# Patient Record
Sex: Female | Born: 1955 | Race: White | Hispanic: No | Smoking: Current every day smoker
Health system: Southern US, Community
[De-identification: ages and names within clinical notes are randomized; demographics above are authoritative.]

## PROBLEM LIST (undated history)

## (undated) DIAGNOSIS — F419 Anxiety disorder, unspecified: Secondary | ICD-10-CM

## (undated) DIAGNOSIS — B182 Chronic viral hepatitis C: Secondary | ICD-10-CM

## (undated) DIAGNOSIS — K219 Gastro-esophageal reflux disease without esophagitis: Secondary | ICD-10-CM

## (undated) DIAGNOSIS — I10 Essential (primary) hypertension: Secondary | ICD-10-CM

## (undated) DIAGNOSIS — T7840XA Allergy, unspecified, initial encounter: Secondary | ICD-10-CM

## (undated) DIAGNOSIS — H269 Unspecified cataract: Secondary | ICD-10-CM

## (undated) DIAGNOSIS — M1990 Unspecified osteoarthritis, unspecified site: Secondary | ICD-10-CM

## (undated) DIAGNOSIS — E785 Hyperlipidemia, unspecified: Secondary | ICD-10-CM

## (undated) DIAGNOSIS — F32A Depression, unspecified: Secondary | ICD-10-CM

## (undated) DIAGNOSIS — F329 Major depressive disorder, single episode, unspecified: Secondary | ICD-10-CM

## (undated) DIAGNOSIS — F1910 Other psychoactive substance abuse, uncomplicated: Secondary | ICD-10-CM

## (undated) HISTORY — PX: FRACTURE SURGERY: SHX138

## (undated) HISTORY — DX: Chronic viral hepatitis C: B18.2

## (undated) HISTORY — DX: Allergy, unspecified, initial encounter: T78.40XA

## (undated) HISTORY — DX: Major depressive disorder, single episode, unspecified: F32.9

## (undated) HISTORY — DX: Anxiety disorder, unspecified: F41.9

## (undated) HISTORY — PX: NEPHRECTOMY: SHX65

## (undated) HISTORY — DX: Gastro-esophageal reflux disease without esophagitis: K21.9

## (undated) HISTORY — DX: Depression, unspecified: F32.A

## (undated) HISTORY — DX: Hyperlipidemia, unspecified: E78.5

## (undated) HISTORY — DX: Unspecified cataract: H26.9

## (undated) HISTORY — DX: Other psychoactive substance abuse, uncomplicated: F19.10

## (undated) HISTORY — DX: Unspecified osteoarthritis, unspecified site: M19.90

## (undated) HISTORY — PX: COLONOSCOPY: SHX174

## (undated) HISTORY — DX: Essential (primary) hypertension: I10

---

## 2001-06-30 ENCOUNTER — Other Ambulatory Visit: Admission: RE | Admit: 2001-06-30 | Discharge: 2001-06-30 | Payer: Self-pay | Admitting: Obstetrics & Gynecology

## 2002-09-14 ENCOUNTER — Other Ambulatory Visit: Admission: RE | Admit: 2002-09-14 | Discharge: 2002-09-14 | Payer: Self-pay | Admitting: Obstetrics & Gynecology

## 2003-09-19 ENCOUNTER — Other Ambulatory Visit: Admission: RE | Admit: 2003-09-19 | Discharge: 2003-09-19 | Payer: Self-pay | Admitting: Obstetrics & Gynecology

## 2012-04-08 ENCOUNTER — Encounter: Payer: Self-pay | Admitting: Family Medicine

## 2012-04-08 ENCOUNTER — Ambulatory Visit (INDEPENDENT_AMBULATORY_CARE_PROVIDER_SITE_OTHER): Payer: PRIVATE HEALTH INSURANCE | Admitting: Family Medicine

## 2012-04-08 VITALS — BP 148/80 | HR 82 | Temp 98.4°F | Resp 16 | Ht 63.5 in | Wt 148.2 lb

## 2012-04-08 DIAGNOSIS — L989 Disorder of the skin and subcutaneous tissue, unspecified: Secondary | ICD-10-CM

## 2012-04-08 DIAGNOSIS — Z23 Encounter for immunization: Secondary | ICD-10-CM

## 2012-04-08 DIAGNOSIS — F17200 Nicotine dependence, unspecified, uncomplicated: Secondary | ICD-10-CM

## 2012-04-08 DIAGNOSIS — E559 Vitamin D deficiency, unspecified: Secondary | ICD-10-CM

## 2012-04-08 DIAGNOSIS — Z0000 Encounter for general adult medical examination without abnormal findings: Secondary | ICD-10-CM

## 2012-04-08 MED ORDER — INFLUENZA VIRUS VACC SPLIT PF IM SUSP: 0.5000 mL | INTRAMUSCULAR | Status: DC

## 2012-04-08 NOTE — Progress Notes (Signed)
@[email or web address] @  Patient ID: Jodi Pruitt MRN: [id number], DOB: 04/15/1956, 56 y.o. Date of Encounter: 04/08/2012, 11:34 AM  Primary Physician: No primary provider on file.  Chief Complaint: Physical (CPE)  HPI: 56 y.o. y/o female with history of noted below here for CPE.  Doing well.  Son working at [employer] now and no longer into drugs. Left cheek scaly lesion  Patient interested in hypnosis to quit smoking.  She has tried Chantix (depression), patches, and counseling. Colonoscopy at age 49 Pap:  Sept 2013  Dr. Aldona Bar MMG: Sept 2013 Review of Systems: Consitutional: No fever, chills, fatigue, night sweats, lymphadenopathy, or weight changes. Eyes: No visual changes, eye redness, or discharge. ENT/Mouth: Ears: No otalgia, tinnitus, hearing loss, discharge. Nose: No congestion, rhinorrhea, sinus pain, or epistaxis. Throat: No sore throat, post nasal drip, or teeth pain. Cardiovascular: No CP, palpitations, diaphoresis, DOE, edema, orthopnea, PND. Respiratory: No cough, hemoptysis, SOB, or wheezing. Gastrointestinal: No anorexia, dysphagia, reflux, pain, nausea, vomiting, hematemesis, diarrhea, constipation, BRBPR, or melena. Breast: No discharge, pain, swelling, or mass. Genitourinary: No dysuria, frequency, urgency, hematuria, incontinence, nocturia, amenorrhea, vaginal discharge, pruritis, burning, abnormal bleeding, or pain. Musculoskeletal: No decreased ROM, myalgias, stiffness, joint swelling, or weakness. Skin: No rash, erythema,  pain, warmth, jaundice, or pruritis.  Left cheek pigmented lesion x 1 year Neurological: No headache, dizziness, syncope, seizures, tremors, memory loss, coordination problems, or paresthesias. Psychological: No anxiety, depression, hallucinations, SI/HI. Endocrine: No fatigue, polydipsia, polyphagia, polyuria, or known diabetes. All other systems were reviewed and are otherwise negative.  Past Medical History  Diagnosis Date  . Depression        No past surgical history on file.  Nephrectomy, c/section  Home Meds:  Prior to Admission medications   Medication Sig Start Date End Date Taking? Authorizing Provider  Citalopram Hydrobromide (CELEXA PO) Take 30 mg by mouth daily.   Yes Historical Provider, MD  Multiple Vitamin (MULTIVITAMIN) tablet Take 1 tablet by mouth daily.   Yes Historical Provider, MD  Naproxen Sodium (ALEVE PO) Take by mouth as needed.   Yes Historical Provider, MD    Allergies:  Allergies  Allergen Reactions  . Compazine (Prochlorperazine Edisylate) Other (See Comments)    HALLUCINATION    History   Social History  . Marital Status: Unknown    Spouse Name: N/A    Number of Children: N/A  . Years of Education: N/A   Occupational History  . Not on file.   Social History Main Topics  . Smoking status: Current Every Day Smoker -- 1.5 packs/day for 40 years    Types: Cigarettes  . Smokeless tobacco: Not on file  . Alcohol Use: Yes     DRINK WINE  . Drug Use: Not on file  . Sexually Active: Not on file   Other Topics Concern  . Not on file   Social History Narrative  . No narrative on file    Family History  Problem Relation Age of Onset  . Hypertension Mother   . Parkinson's disease Father   . Heart disease Father   . Cancer Sister   . Cancer Maternal Grandmother     Physical Exam: Blood pressure 148/80, pulse 82, temperature 98.4 F (36.9 C), temperature source Oral, resp. rate 16, height 5' 3.5" (1.613 m), weight 148 lb 3.2 oz (67.223 kg), SpO2 96.00%., Body mass index is 25.84 kg/(m^2). General: Well developed, well nourished, in no acute distress. HEENT: Normocephalic, atraumatic. Conjunctiva pink, sclera non-icteric. Pupils 2 mm constricting to 1  mm, round, regular, and equally reactive to light and accomodation. EOMI. Internal auditory canal clear. TMs with good cone of light and without pathology. Nasal mucosa pink. Nares are without discharge. No sinus tenderness. Oral mucosa  pink. Dentition good. Pharynx without exudate.   Neck: Supple. Trachea midline. No thyromegaly. Full ROM. No lymphadenopathy. Lungs: Clear to auscultation bilaterally without wheezes, rales, or rhonchi. Breathing is of normal effort and unlabored. Cardiovascular: RRR with S1 S2. No murmurs, rubs, or gallops appreciated. Distal pulses 2+ symmetrically. No carotid or abdominal bruits. Abdomen: Soft, non-tender, non-distended with normoactive bowel sounds. No hepatosplenomegaly or masses. No rebound/guarding. No CVA tenderness. Without hernias.  Musculoskeletal: Full range of motion and 5/5 strength throughout. Without swelling, atrophy, tenderness, crepitus, or warmth. Extremities without clubbing, cyanosis, or edema. Calves supple. Skin: Warm and moist without erythema, ecchymosis, wounds, or rash.  Left 2 mm cheek pigmented irreg lesion.  Right varicose veins Neuro: A+Ox3. CN II-XII grossly intact. Moves all extremities spontaneously. Full sensation throughout. Normal gait. DTR 2+ throughout upper and lower extremities. Finger to nose intact. Psych:  Responds to questions appropriately with a normal affect.   Lipids normal from Dr. 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  Interested in quitting smoking. Flu shot given 1. Healthcare maintenance  influenza  inactive virus vaccine (FLUZONE/FLUARIX) injection 0.5 mL  2. Vitamin D deficiency    3. Skin lesion of cheek  Ambulatory referral to Dermatology  4. Smoking addiction  Ambulatory referral to Psychology  5. Need for prophylactic vaccination and inoculation against influenza       Signed, Elvina Sidle, MD 04/08/2012 11:34 AM

## 2012-04-08 NOTE — Patient Instructions (Addendum)

## 2015-09-04 ENCOUNTER — Ambulatory Visit (INDEPENDENT_AMBULATORY_CARE_PROVIDER_SITE_OTHER): Payer: BLUE CROSS/BLUE SHIELD | Admitting: Family Medicine

## 2015-09-04 ENCOUNTER — Ambulatory Visit (INDEPENDENT_AMBULATORY_CARE_PROVIDER_SITE_OTHER): Payer: BLUE CROSS/BLUE SHIELD

## 2015-09-04 VITALS — BP 156/88 | HR 80 | Temp 98.0°F | Resp 16 | Ht 63.5 in | Wt 164.0 lb

## 2015-09-04 DIAGNOSIS — F329 Major depressive disorder, single episode, unspecified: Secondary | ICD-10-CM

## 2015-09-04 DIAGNOSIS — F32A Depression, unspecified: Secondary | ICD-10-CM | POA: Insufficient documentation

## 2015-09-04 DIAGNOSIS — Z0000 Encounter for general adult medical examination without abnormal findings: Secondary | ICD-10-CM

## 2015-09-04 DIAGNOSIS — R1012 Left upper quadrant pain: Secondary | ICD-10-CM

## 2015-09-04 DIAGNOSIS — J410 Simple chronic bronchitis: Secondary | ICD-10-CM | POA: Diagnosis not present

## 2015-09-04 DIAGNOSIS — B182 Chronic viral hepatitis C: Secondary | ICD-10-CM

## 2015-09-04 LAB — POCT URINALYSIS DIP (MANUAL ENTRY)
Bilirubin, UA: NEGATIVE
Blood, UA: NEGATIVE
Glucose, UA: NEGATIVE
Ketones, POC UA: NEGATIVE
Leukocytes, UA: NEGATIVE
Nitrite, UA: NEGATIVE
Spec Grav, UA: 1.015
Urobilinogen, UA: 0.2
pH, UA: 6

## 2015-09-04 LAB — POCT CBC
Granulocyte percent: 54.1 %G (ref 37–80)
HCT, POC: 45 % (ref 37.7–47.9)
Hemoglobin: 15.8 g/dL (ref 12.2–16.2)
Lymph, poc: 3.9 — AB (ref 0.6–3.4)
MCH, POC: 32.5 pg — AB (ref 27–31.2)
MCHC: 35 g/dL (ref 31.8–35.4)
MCV: 93 fL (ref 80–97)
MID (cbc): 0.5 (ref 0–0.9)
MPV: 6.6 fL (ref 0–99.8)
POC Granulocyte: 5.2 (ref 2–6.9)
POC LYMPH PERCENT: 40.2 %L (ref 10–50)
POC MID %: 5.7 %M (ref 0–12)
Platelet Count, POC: 314 10*3/uL (ref 142–424)
RBC: 4.84 M/uL (ref 4.04–5.48)
RDW, POC: 12.9 %
WBC: 9.6 10*3/uL (ref 4.6–10.2)

## 2015-09-04 MED ORDER — CITALOPRAM HYDROBROMIDE 20 MG PO TABS: 30.0000 mg | ORAL_TABLET | Freq: Every day | ORAL | Status: DC

## 2015-09-04 NOTE — Progress Notes (Addendum)
By signing my name below, I, Moises Blood, attest that this documentation has been prepared under the direction and in the presence of Robyn Haber, MD. Electronically Signed: Moises Blood, [place]. 09/04/2015 , 6:55 PM .  Patient was seen in room 11 .   Patient ID: Jodi Pruitt MRN: [id number], DOB: 26-May-1956, 60 y.o. Date of Encounter: 09/04/2015  Primary Physician: No primary care provider on file.  Chief Complaint:  Chief Complaint  Patient presents with  . Annual Exam  . Flank Pain    Left    HPI:  Jodi Pruitt is a 60 y.o. female who presents to [facility] for an annual physical. Her OBGYN (Dr. Stann Mainland) usually does her annual physical in the last few years.   Cancer Screening She notes that colonoscopy is due. Her previous one was done over 10 years ago; reports having polyps, but normal.  She plans to see them for pap smear and mammogram in April.   Left Flank Pain She also notes having left rib pain that was noticed for a few months. When she bends and leans over, she feels like something is being pinched.   Varicose veins She's been on her feet for work over 40 years. She's tried compression socks in the past but without much relief.   Suicidal ideations She's been dealing with a lot of family stress. Her mother had a stroke and for some time, she quit her job to take care of her full time. Her sister also has cancer.   Cough She mentions cough is persistent due to still smoking.   Work She works full time as a Aeronautical engineer at [employer].   Past Medical History  Diagnosis Date  . Depression   . Arthritis      Home Meds: Prior to Admission medications   Medication Sig Start Date End Date Taking? Authorizing Provider  Citalopram Hydrobromide (CELEXA PO) Take 30 mg by mouth daily.    Historical Provider, MD  Multiple Vitamin (MULTIVITAMIN) tablet Take 1 tablet by mouth daily.    Historical Provider, MD    Naproxen Sodium (ALEVE PO) Take by mouth as needed.    Historical Provider, MD    Allergies:  Allergies  Allergen Reactions  . Compazine [Prochlorperazine Edisylate] Other (See Comments)    HALLUCINATION    Social History   Social History  . Marital Status: Unknown    Spouse Name: N/A  . Number of Children: N/A  . Years of Education: N/A   Occupational History  . Not on file.   Social History Main Topics  . Smoking status: Current Every Day Smoker -- 1.50 packs/day for 40 years    Types: Cigarettes  . Smokeless tobacco: Not on file  . Alcohol Use: Yes     Comment: DRINK WINE  . Drug Use: Not on file  . Sexual Activity: Not on file   Other Topics Concern  . Not on file   Social History Narrative     Review of Systems: Constitutional: negative for fever, chills, night sweats, weight changes, or fatigue  HEENT: negative for vision changes, hearing loss, congestion, rhinorrhea, ST, epistaxis, or sinus pressure Cardiovascular: negative for chest pain or palpitations Respiratory: negative for hemoptysis, wheezing, shortness of breath; positive for cough Abdominal: negative for abdominal pain, nausea, vomiting, diarrhea, or constipation Dermatological: negative for rash Musc: positive for left rib pain Neurologic: negative for headache, dizziness, or syncope All other systems reviewed and are otherwise negative  with the exception to those above and in the HPI.  Physical Exam:  Blood pressure 156/88, pulse 80, temperature 98 F (36.7 C), temperature source Oral, resp. rate 16, height 5' 3.5" (1.613 m), weight 164 lb (74.39 kg), SpO2 98 %., Body mass index is 28.59 kg/(m^2). General: Well developed, well nourished, in no acute distress; patient broke down in tears and cried when talking about her mother going to assisted living Head: Normocephalic, atraumatic, eyes without discharge, sclera non-icteric, nares are without discharge. Bilateral auditory canals clear, TM's are  without perforation, pearly grey and translucent with reflective cone of light bilaterally. Oral cavity moist, posterior pharynx without exudate, erythema, peritonsillar abscess, or post nasal drip.  Neck: Supple. No thyromegaly. Full ROM. No lymphadenopathy, no cervical, axillary or inguinal adenopathy Lungs: Clear bilaterally to auscultation without rales, or rhonchi. Breathing is unlabored. Expiratory wheezes bilaterally Heart: RRR with S1 S2. No murmurs, rubs, or gallops appreciated. Abdomen: Soft, non-tender, non-distended with normoactive bowel sounds. No hepatomegaly. No rebound/guarding. No obvious abdominal masses. Msk:  Strength and tone normal for age. Extremities/Skin: Warm and dry. No clubbing or cyanosis. No edema. No rashes or suspicious lesions. Neuro: Alert and oriented X 3. Moves all extremities spontaneously. Gait is normal. CNII-XII grossly in tact. Psych:  Responds to questions appropriately with a normal affect.   Labs: [facility] reading (PRIMARY) by  Dr. Joseph Art: chest xray, prominent left hilum without infiltrates; with gas bubble in LUQ     ASSESSMENT AND PLAN:  60 y.o. year old female with   Will see Dr. Stann Mainland in April. I'm referring her to GI for colonoscopy.  I wrote for a shingles vaccine.   Annual physical exam - Plan: POCT CBC, COMPLETE METABOLIC PANEL WITH GFR, Lipid panel, TSH, Hepatitis panel, acute, DG Abd Acute W/Chest, POCT urinalysis dipstick  LUQ abdominal pain - Plan: Hepatitis panel, acute, DG Abd Acute W/Chest, POCT urinalysis dipstick  Simple chronic bronchitis (HCC) - Plan: DG Abd Acute W/Chest, POCT urinalysis dipstick  Chronic hepatitis C without hepatic coma (HCC) - Plan: Amb Referral to Hepatology  Depression - Plan: citalopram (CELEXA) 20 MG tablet  Although patient has had quite a tough time with her depression, she seems to be doing better now that her mother is in a assisted living facility. It's been a tough call for her because her  sister was not able to help Signed, Robyn Haber, MD 09/04/2015 7:28 PM

## 2015-09-04 NOTE — Patient Instructions (Addendum)
Health Maintenance, Female Adopting a healthy lifestyle and getting preventive care can go a long way to promote health and wellness. Talk with your health care provider about what schedule of regular examinations is right for you. This is a good chance for you to check in with your provider about disease prevention and staying healthy. In between checkups, there are plenty of things you can do on your own. Experts have done a lot of research about which lifestyle changes and preventive measures are most likely to keep you healthy. Ask your health care provider for more information. WEIGHT AND DIET  Eat a healthy diet  Be sure to include plenty of vegetables, fruits, low-fat dairy products, and lean protein.  Do not eat a lot of foods high in solid fats, added sugars, or salt.  Get regular exercise. This is one of the most important things you can do for your health.  Most adults should exercise for at least 150 minutes each week. The exercise should increase your heart rate and make you sweat (moderate-intensity exercise).  Most adults should also do strengthening exercises at least twice a week. This is in addition to the moderate-intensity exercise.  Maintain a healthy weight  Body mass index (BMI) is a measurement that can be used to identify possible weight problems. It estimates body fat based on height and weight. Your health care provider can help determine your BMI and help you achieve or maintain a healthy weight.  For females 20 years of age and older:   A BMI below 18.5 is considered underweight.  A BMI of 18.5 to 24.9 is normal.  A BMI of 25 to 29.9 is considered overweight.  A BMI of 30 and above is considered obese.  Watch levels of cholesterol and blood lipids  You should start having your blood tested for lipids and cholesterol at 62 years of age, then have this test every 5 years.  You may need to have your cholesterol levels checked more often if:  Your lipid  or cholesterol levels are high.  You are older than 62 years of age.  You are at high risk for heart disease.  CANCER SCREENING   Lung Cancer  Lung cancer screening is recommended for adults 55-80 years old who are at high risk for lung cancer because of a history of smoking.  A yearly low-dose CT scan of the lungs is recommended for people who:  Currently smoke.  Have quit within the past 15 years.  Have at least a 30-pack-year history of smoking. A pack year is smoking an average of one pack of cigarettes a day for 1 year.  Yearly screening should continue until it has been 15 years since you quit.  Yearly screening should stop if you develop a health problem that would prevent you from having lung cancer treatment.  Breast Cancer  Practice breast self-awareness. This means understanding how your breasts normally appear and feel.  It also means doing regular breast self-exams. Let your health care provider know about any changes, no matter how small.  If you are in your 20s or 30s, you should have a clinical breast exam (CBE) by a health care provider every 1-3 years as part of a regular health exam.  If you are 40 or older, have a CBE every year. Also consider having a breast X-ray (mammogram) every year.  If you have a family history of breast cancer, talk to your health care provider about genetic screening.  If you   are at high risk for breast cancer, talk to your health care provider about having an MRI and a mammogram every year.  Breast cancer gene (BRCA) assessment is recommended for women who have family members with BRCA-related cancers. BRCA-related cancers include:  Breast.  Ovarian.  Tubal.  Peritoneal cancers.  Results of the assessment will determine the need for genetic counseling and BRCA1 and BRCA2 testing. Cervical Cancer Your health care provider may recommend that you be screened regularly for cancer of the pelvic organs (ovaries, uterus, and  vagina). This screening involves a pelvic examination, including checking for microscopic changes to the surface of your cervix (Pap test). You may be encouraged to have this screening done every 3 years, beginning at age 21.  For women ages 30-65, health care providers may recommend pelvic exams and Pap testing every 3 years, or they may recommend the Pap and pelvic exam, combined with testing for human papilloma virus (HPV), every 5 years. Some types of HPV increase your risk of cervical cancer. Testing for HPV may also be done on women of any age with unclear Pap test results.  Other health care providers may not recommend any screening for nonpregnant women who are considered low risk for pelvic cancer and who do not have symptoms. Ask your health care provider if a screening pelvic exam is right for you.  If you have had past treatment for cervical cancer or a condition that could lead to cancer, you need Pap tests and screening for cancer for at least 20 years after your treatment. If Pap tests have been discontinued, your risk factors (such as having a new sexual partner) need to be reassessed to determine if screening should resume. Some women have medical problems that increase the chance of getting cervical cancer. In these cases, your health care provider may recommend more frequent screening and Pap tests. Colorectal Cancer  This type of cancer can be detected and often prevented.  Routine colorectal cancer screening usually begins at 62 years of age and continues through 62 years of age.  Your health care provider may recommend screening at an earlier age if you have risk factors for colon cancer.  Your health care provider may also recommend using home test kits to check for hidden blood in the stool.  A small camera at the end of a tube can be used to examine your colon directly (sigmoidoscopy or colonoscopy). This is done to check for the earliest forms of colorectal  cancer.  Routine screening usually begins at age 50.  Direct examination of the colon should be repeated every 5-10 years through 62 years of age. However, you may need to be screened more often if early forms of precancerous polyps or small growths are found. Skin Cancer  Check your skin from head to toe regularly.  Tell your health care provider about any new moles or changes in moles, especially if there is a change in a mole's shape or color.  Also tell your health care provider if you have a mole that is larger than the size of a pencil eraser.  Always use sunscreen. Apply sunscreen liberally and repeatedly throughout the day.  Protect yourself by wearing long sleeves, pants, a wide-brimmed hat, and sunglasses whenever you are outside. HEART DISEASE, DIABETES, AND HIGH BLOOD PRESSURE   High blood pressure causes heart disease and increases the risk of stroke. High blood pressure is more likely to develop in:  People who have blood pressure in the high end   of the normal range (130-139/85-89 mm Hg).  People who are overweight or obese.  People who are African American.  If you are 18-39 years of age, have your blood pressure checked every 3-5 years. If you are 40 years of age or older, have your blood pressure checked every year. You should have your blood pressure measured twice--once when you are at a hospital or clinic, and once when you are not at a hospital or clinic. Record the average of the two measurements. To check your blood pressure when you are not at a hospital or clinic, you can use:  An automated blood pressure machine at a pharmacy.  A home blood pressure monitor.  If you are between 55 years and 79 years old, ask your health care provider if you should take aspirin to prevent strokes.  Have regular diabetes screenings. This involves taking a blood sample to check your fasting blood sugar level.  If you are at a normal weight and have a low risk for diabetes,  have this test once every three years after 62 years of age.  If you are overweight and have a high risk for diabetes, consider being tested at a younger age or more often. PREVENTING INFECTION  Hepatitis B  If you have a higher risk for hepatitis B, you should be screened for this virus. You are considered at high risk for hepatitis B if:  You were born in a country where hepatitis B is common. Ask your health care provider which countries are considered high risk.  Your parents were born in a high-risk country, and you have not been immunized against hepatitis B (hepatitis B vaccine).  You have HIV or AIDS.  You use needles to inject street drugs.  You live with someone who has hepatitis B.  You have had sex with someone who has hepatitis B.  You get hemodialysis treatment.  You take certain medicines for conditions, including cancer, organ transplantation, and autoimmune conditions. Hepatitis C  Blood testing is recommended for:  Everyone born from 1945 through 1965.  Anyone with known risk factors for hepatitis C. Sexually transmitted infections (STIs)  You should be screened for sexually transmitted infections (STIs) including gonorrhea and chlamydia if:  You are sexually active and are younger than 62 years of age.  You are older than 62 years of age and your health care provider tells you that you are at risk for this type of infection.  Your sexual activity has changed since you were last screened and you are at an increased risk for chlamydia or gonorrhea. Ask your health care provider if you are at risk.  If you do not have HIV, but are at risk, it may be recommended that you take a prescription medicine daily to prevent HIV infection. This is called pre-exposure prophylaxis (PrEP). You are considered at risk if:  You are sexually active and do not regularly use condoms or know the HIV status of your partner(s).  You take drugs by injection.  You are sexually  active with a partner who has HIV. Talk with your health care provider about whether you are at high risk of being infected with HIV. If you choose to begin PrEP, you should first be tested for HIV. You should then be tested every 3 months for as long as you are taking PrEP.  PREGNANCY   If you are premenopausal and you may become pregnant, ask your health care provider about preconception counseling.  If you may   become pregnant, take 400 to 800 micrograms (mcg) of folic acid every day.  If you want to prevent pregnancy, talk to your health care provider about birth control (contraception). OSTEOPOROSIS AND MENOPAUSE   Osteoporosis is a disease in which the bones lose minerals and strength with aging. This can result in serious bone fractures. Your risk for osteoporosis can be identified using a bone density scan.  If you are 65 years of age or older, or if you are at risk for osteoporosis and fractures, ask your health care provider if you should be screened.  Ask your health care provider whether you should take a calcium or vitamin D supplement to lower your risk for osteoporosis.  Menopause may have certain physical symptoms and risks.  Hormone replacement therapy may reduce some of these symptoms and risks. Talk to your health care provider about whether hormone replacement therapy is right for you.  HOME CARE INSTRUCTIONS   Schedule regular health, dental, and eye exams.  Stay current with your immunizations.   Do not use any tobacco products including cigarettes, chewing tobacco, or electronic cigarettes.  If you are pregnant, do not drink alcohol.  If you are breastfeeding, limit how much and how often you drink alcohol.  Limit alcohol intake to no more than 1 drink per day for nonpregnant women. One drink equals 12 ounces of beer, 5 ounces of wine, or 1 ounces of hard liquor.  Do not use street drugs.  Do not share needles.  Ask your health care provider for help if  you need support or information about quitting drugs.  Tell your health care provider if you often feel depressed.  Tell your health care provider if you have ever been abused or do not feel safe at home.   This information is not intended to replace advice given to you by your health care provider. Make sure you discuss any questions you have with your health care provider.   Document Released: 12/23/2010 Document Revised: 06/30/2014 Document Reviewed: 05/11/2013 Elsevier Interactive Patient Education 2016 [employer]  

## 2015-09-05 LAB — COMPLETE METABOLIC PANEL WITH GFR
ALT: 25 U/L (ref 6–29)
AST: 33 U/L (ref 10–35)
Albumin: 4.2 g/dL (ref 3.6–5.1)
Alkaline Phosphatase: 52 U/L (ref 33–130)
BUN: 19 mg/dL (ref 7–25)
CO2: 24 mmol/L (ref 20–31)
Calcium: 9.3 mg/dL (ref 8.6–10.4)
Chloride: 100 mmol/L (ref 98–110)
Creat: 0.89 mg/dL (ref 0.50–0.99)
GFR, Est African American: 81 mL/min (ref >=60)
GFR, Est Non African American: 70 mL/min (ref >=60)
Glucose, Bld: 84 mg/dL (ref 65–99)
Potassium: 4.4 mmol/L (ref 3.5–5.3)
Sodium: 138 mmol/L (ref 135–146)
Total Bilirubin: 0.7 mg/dL (ref 0.2–1.2)
Total Protein: 8 g/dL (ref 6.1–8.1)

## 2015-09-05 LAB — LIPID PANEL
Cholesterol: 208 mg/dL — ABNORMAL HIGH (ref 125–200)
HDL: 50 mg/dL (ref >=46)
LDL Cholesterol: 139 mg/dL — ABNORMAL HIGH (ref <130)
Total CHOL/HDL Ratio: 4.2 Ratio (ref <=5.0)
Triglycerides: 93 mg/dL (ref <150)
VLDL: 19 mg/dL (ref <30)

## 2015-09-05 LAB — TSH: TSH: 1.62 mIU/L

## 2015-09-06 LAB — HEPATITIS PANEL, ACUTE
HCV Ab: REACTIVE — AB
Hep A IgM: NONREACTIVE
Hep B C IgM: NONREACTIVE
Hepatitis B Surface Ag: NEGATIVE

## 2015-09-07 LAB — HEPATITIS C RNA QUANTITATIVE
HCV Quantitative Log: 6.92 {Log} — ABNORMAL HIGH (ref <1.18)
HCV Quantitative: 8287587 IU/mL — ABNORMAL HIGH (ref <15)

## 2015-10-26 ENCOUNTER — Other Ambulatory Visit: Payer: Self-pay

## 2016-05-29 ENCOUNTER — Other Ambulatory Visit: Payer: Self-pay

## 2016-05-29 MED ORDER — CITALOPRAM HYDROBROMIDE 20 MG PO TABS: 30.0000 mg | ORAL_TABLET | Freq: Every day | ORAL | 0 refills | Status: DC

## 2016-05-29 NOTE — Telephone Encounter (Signed)
New pharm reqd refills of citalopram. Dr L had given pt yrs worth of RFs in March. Sent in the remaining RFs to [place].

## 2016-09-25 ENCOUNTER — Other Ambulatory Visit: Payer: Self-pay | Admitting: Family Medicine

## 2016-09-25 DIAGNOSIS — F329 Major depressive disorder, single episode, unspecified: Secondary | ICD-10-CM

## 2016-09-25 DIAGNOSIS — F32A Depression, unspecified: Secondary | ICD-10-CM

## 2016-10-28 ENCOUNTER — Other Ambulatory Visit: Payer: Self-pay | Admitting: Physician Assistant

## 2016-10-28 DIAGNOSIS — F32A Depression, unspecified: Secondary | ICD-10-CM

## 2016-10-28 DIAGNOSIS — F329 Major depressive disorder, single episode, unspecified: Secondary | ICD-10-CM

## 2016-10-31 NOTE — Telephone Encounter (Signed)
IC pt re: refill on citalopram - pt needs OV. Pt states she is going to another MD because she needs 90 day supply per her insurance carrier.  She stated take her off our list.   I advised her to call her pharmacy and they will stop sending refill requests to [place].

## 2016-12-30 ENCOUNTER — Encounter: Payer: Self-pay | Admitting: Physician Assistant

## 2016-12-30 ENCOUNTER — Ambulatory Visit (INDEPENDENT_AMBULATORY_CARE_PROVIDER_SITE_OTHER): Payer: Commercial Managed Care - PPO | Admitting: Physician Assistant

## 2016-12-30 VITALS — BP 151/81 | HR 81 | Temp 98.9°F | Resp 17 | Ht 64.5 in | Wt 159.0 lb

## 2016-12-30 DIAGNOSIS — F32A Depression, unspecified: Secondary | ICD-10-CM

## 2016-12-30 DIAGNOSIS — F329 Major depressive disorder, single episode, unspecified: Secondary | ICD-10-CM

## 2016-12-30 DIAGNOSIS — I10 Essential (primary) hypertension: Secondary | ICD-10-CM | POA: Diagnosis not present

## 2016-12-30 MED ORDER — CITALOPRAM HYDROBROMIDE 20 MG PO TABS: 30.0000 mg | ORAL_TABLET | Freq: Every day | ORAL | 3 refills | Status: DC

## 2016-12-30 MED ORDER — LISINOPRIL 20 MG PO TABS: 20.0000 mg | ORAL_TABLET | Freq: Every day | ORAL | 3 refills | Status: DC

## 2016-12-30 NOTE — Progress Notes (Signed)
PRIMARY CARE AT [facility] [address] [place] [address] [phone]  Date:  12/30/2016   Name:  Jodi Pruitt   DOB:  June 01, 1956   MRN:  [id number]  PCP:  Joretta Bachelor, PA    History of Present Illness:  Jodi Pruitt is a 61 y.o. female patient who presents to PCP with  Chief Complaint  Patient presents with  . Hypertension     4 days ago [facility] was [facility] at [facility].  ekg performed.  She noticed that the sensation has improved.  Neck pain and hearing a "chirping".  No chest pains. She was encouraged to visit a pcp and to treat her blood pressure.   She hsa been taking celexa for years, she notes that this helps her depression.  She has not had the medication in about 10 days.  She has noted some easy emotion and crying.  She has current stressors with son who lives with her.  He is in his 39s with substance abuse issues.    Patient Active Problem List   Diagnosis Date Noted  . Hepatitis C 09/04/2015  . LUQ abdominal pain 09/04/2015  . Simple chronic bronchitis ([place]) 09/04/2015  . Depression 09/04/2015    Past Medical History:  Diagnosis Date  . Arthritis   . Depression     Past Surgical History:  Procedure Laterality Date  . CESAREAN SECTION    . NEPHRECTOMY      Social History  Substance Use Topics  . Smoking status: Current Every Day Smoker    Packs/day: 1.50    Years: 40.00    Types: Cigarettes  . Smokeless tobacco: Never Used  . Alcohol use Yes     Comment: DRINK WINE    Family History  Problem Relation Age of Onset  . Hypertension Mother   . Heart disease Mother   . Hyperlipidemia Mother   . Stroke Mother   . Parkinson's disease Father   . Heart disease Father   . Cancer Sister   . Cancer Maternal Grandmother     Allergies  Allergen Reactions  . Compazine [Prochlorperazine Edisylate] Other (See Comments)    HALLUCINATION    Medication list has been reviewed and updated.  Current Outpatient  Prescriptions on File Prior to Visit  Medication Sig Dispense Refill  . citalopram (CELEXA) 20 MG tablet Take 1.5 tablets (30 mg total) by mouth daily. (Patient taking differently: Take 40 mg by mouth daily. ) 135 tablet 0  . citalopram (CELEXA) 20 MG tablet TAKE 1 AND 1/2 TABLETS(30 MG) BY MOUTH DAILY 45 tablet 0  . Multiple Vitamin (MULTIVITAMIN) tablet Take 1 tablet by mouth daily. Reported on 09/04/2015    . Naproxen Sodium (ALEVE PO) Take by mouth as needed. Reported on 09/04/2015     No current facility-administered medications on file prior to visit.     ROS ROS otherwise unremarkable unless listed above.  Physical Examination: BP (!) 151/81   Pulse 81   Temp 98.9 F (37.2 C) (Oral)   Resp 17   Ht 5' 4.5" (1.638 m)   Wt 159 lb (72.1 kg)   SpO2 98%   BMI 26.87 kg/m  Ideal Body Weight: Weight in (lb) to have BMI = 25: 147.6  Physical Exam  Constitutional: She is oriented to person, place, and time. She appears well-developed and well-nourished. No distress.  HENT:  Head: Normocephalic and atraumatic.  Right Ear: External ear normal.  Left Ear: External ear normal.  Eyes: Pupils are equal, round, and reactive to light. Conjunctivae and EOM are normal.  Cardiovascular: Normal rate, regular rhythm and normal heart sounds.  Exam reveals no gallop and no friction rub.   No murmur heard. Pulses:      Radial pulses are 2+ on the right side, and 2+ on the left side.  Pulmonary/Chest: Effort normal. No respiratory distress.  Neurological: She is alert and oriented to person, place, and time.  Skin: She is not diaphoretic.  Psychiatric: She has a normal mood and affect. Her behavior is normal.     Assessment and Plan: [name] is a 61 y.o. female who is here today for blood pressure. Advised to return in 1 month.   Will fill celexa for 1 year.   Printed labs to obtain at her workplace.    Essential hypertension - Plan: lisinopril (PRINIVIL,ZESTRIL) 20 MG  tablet, CMP14+EGFR, Lipid panel, TSH  Depression, unspecified depression type - Plan: citalopram (CELEXA) 20 MG tablet  Ivar Drape, PA-C [facility] 7/13/20189:22 AM  There are no diagnoses linked to this encounter.  Ivar Drape, PA-C [facility] 12/30/2016 9:42 AM

## 2016-12-30 NOTE — Patient Instructions (Addendum)
DASH Eating Plan DASH stands for "Dietary Approaches to Stop Hypertension." The DASH eating plan is a healthy eating plan that has been shown to reduce high blood pressure (hypertension). It may also reduce your risk for type 2 diabetes, heart disease, and stroke. The DASH eating plan may also help with weight loss. What are tips for following this plan? General guidelines  Avoid eating more than 2,300 mg (milligrams) of salt (sodium) a day. If you have hypertension, you may need to reduce your sodium intake to 1,500 mg a day.  Limit alcohol intake to no more than 1 drink a day for nonpregnant women and 2 drinks a day for men. One drink equals 12 oz of beer, 5 oz of wine, or 1 oz of hard liquor.  Work with your health care provider to maintain a healthy body weight or to lose weight. Ask what an ideal weight is for you.  Get at least 30 minutes of exercise that causes your heart to beat faster (aerobic exercise) most days of the week. Activities may include walking, swimming, or biking.  Work with your health care provider or diet and nutrition specialist (dietitian) to adjust your eating plan to your individual calorie needs. Reading food labels  Check food labels for the amount of sodium per serving. Choose foods with less than 5 percent of the Daily Value of sodium. Generally, foods with less than 300 mg of sodium per serving fit into this eating plan.  To find whole grains, look for the word "whole" as the first word in the ingredient list. Shopping  Buy products labeled as "low-sodium" or "no salt added."  Buy fresh foods. Avoid canned foods and premade or frozen meals. Cooking  Avoid adding salt when cooking. Use salt-free seasonings or herbs instead of table salt or sea salt. Check with your health care provider or pharmacist before using salt substitutes.  Do not fry foods. Cook foods using healthy methods such as baking, boiling, grilling, and broiling instead.  Cook with  heart-healthy oils, such as olive, canola, soybean, or sunflower oil. Meal planning   Eat a balanced diet that includes: ? 5 or more servings of fruits and vegetables each day. At each meal, try to fill half of your plate with fruits and vegetables. ? Up to 6-8 servings of whole grains each day. ? Less than 6 oz of lean meat, poultry, or fish each day. A 3-oz serving of meat is about the same size as a deck of cards. One egg equals 1 oz. ? 2 servings of low-fat dairy each day. ? A serving of nuts, seeds, or beans 5 times each week. ? Heart-healthy fats. Healthy fats called Omega-3 fatty acids are found in foods such as flaxseeds and coldwater fish, like sardines, salmon, and mackerel.  Limit how much you eat of the following: ? Canned or prepackaged foods. ? Food that is high in trans fat, such as fried foods. ? Food that is high in saturated fat, such as fatty meat. ? Sweets, desserts, sugary drinks, and other foods with added sugar. ? Full-fat dairy products.  Do not salt foods before eating.  Try to eat at least 2 vegetarian meals each week.  Eat more home-cooked food and less restaurant, buffet, and fast food.  When eating at a restaurant, ask that your food be prepared with less salt or no salt, if possible. What foods are recommended? The items listed may not be a complete list. Talk with your dietitian about what   dietary choices are best for you. Grains Whole-grain or whole-wheat bread. Whole-grain or whole-wheat pasta. Brown rice. Oatmeal. Quinoa. Bulgur. Whole-grain and low-sodium cereals. Pita bread. Low-fat, low-sodium crackers. Whole-wheat flour tortillas. Vegetables Fresh or frozen vegetables (raw, steamed, roasted, or grilled). Low-sodium or reduced-sodium tomato and vegetable juice. Low-sodium or reduced-sodium tomato sauce and tomato paste. Low-sodium or reduced-sodium canned vegetables. Fruits All fresh, dried, or frozen fruit. Canned fruit in natural juice (without  added sugar). Meat and other protein foods Skinless chicken or turkey. Ground chicken or turkey. Pork with fat trimmed off. Fish and seafood. Egg whites. Dried beans, peas, or lentils. Unsalted nuts, nut butters, and seeds. Unsalted canned beans. Lean cuts of beef with fat trimmed off. Low-sodium, lean deli meat. Dairy Low-fat (1%) or fat-free (skim) milk. Fat-free, low-fat, or reduced-fat cheeses. Nonfat, low-sodium ricotta or cottage cheese. Low-fat or nonfat yogurt. Low-fat, low-sodium cheese. Fats and oils Soft margarine without trans fats. Vegetable oil. Low-fat, reduced-fat, or light mayonnaise and salad dressings (reduced-sodium). Canola, safflower, olive, soybean, and sunflower oils. Avocado. Seasoning and other foods Herbs. Spices. Seasoning mixes without salt. Unsalted popcorn and pretzels. Fat-free sweets. What foods are not recommended? The items listed may not be a complete list. Talk with your dietitian about what dietary choices are best for you. Grains Baked goods made with fat, such as croissants, muffins, or some breads. Dry pasta or rice meal packs. Vegetables Creamed or fried vegetables. Vegetables in a cheese sauce. Regular canned vegetables (not low-sodium or reduced-sodium). Regular canned tomato sauce and paste (not low-sodium or reduced-sodium). Regular tomato and vegetable juice (not low-sodium or reduced-sodium). Pickles. Olives. Fruits Canned fruit in a light or heavy syrup. Fried fruit. Fruit in cream or butter sauce. Meat and other protein foods Fatty cuts of meat. Ribs. Fried meat. Bacon. Sausage. Bologna and other processed lunch meats. Salami. Fatback. Hotdogs. Bratwurst. Salted nuts and seeds. Canned beans with added salt. Canned or smoked fish. Whole eggs or egg yolks. Chicken or turkey with skin. Dairy Whole or 2% milk, cream, and half-and-half. Whole or full-fat cream cheese. Whole-fat or sweetened yogurt. Full-fat cheese. Nondairy creamers. Whipped toppings.  Processed cheese and cheese spreads. Fats and oils Butter. Stick margarine. Lard. Shortening. Ghee. Bacon fat. Tropical oils, such as coconut, palm kernel, or palm oil. Seasoning and other foods Salted popcorn and pretzels. Onion salt, garlic salt, seasoned salt, table salt, and sea salt. Worcestershire sauce. Tartar sauce. Barbecue sauce. Teriyaki sauce. Soy sauce, including reduced-sodium. Steak sauce. Canned and packaged gravies. Fish sauce. Oyster sauce. Cocktail sauce. Horseradish that you find on the shelf. Ketchup. Mustard. Meat flavorings and tenderizers. Bouillon cubes. Hot sauce and Tabasco sauce. Premade or packaged marinades. Premade or packaged taco seasonings. Relishes. Regular salad dressings. Where to find more information:  National Heart, Lung, and Blood Institute: www.nhlbi.nih.gov  American Heart Association: www.heart.org Summary  The DASH eating plan is a healthy eating plan that has been shown to reduce high blood pressure (hypertension). It may also reduce your risk for type 2 diabetes, heart disease, and stroke.  With the DASH eating plan, you should limit salt (sodium) intake to 2,300 mg a day. If you have hypertension, you may need to reduce your sodium intake to 1,500 mg a day.  When on the DASH eating plan, aim to eat more fresh fruits and vegetables, whole grains, lean proteins, low-fat dairy, and heart-healthy fats.  Work with your health care provider or diet and nutrition specialist (dietitian) to adjust your eating plan to your individual   individual calorie needs. This information is not intended to replace advice given to you by your health care provider. Make sure you discuss any questions you have with your health care provider. Document Released: 05/29/2011 Document Revised: 06/02/2016 Document Reviewed: 06/02/2016 Elsevier Interactive Patient Education  2017 Elsevier Inc.    IF you received an x-ray today, you will receive an invoice from [facility].  Please contact [facility] at [phone] with questions or concerns regarding your invoice.   IF you received labwork today, you will receive an invoice from [facility]. Please contact [facility] at [phone] with questions or concerns regarding your invoice.   Our billing staff will not be able to assist you with questions regarding bills from these companies.  You will be contacted with the lab results as soon as they are available. The fastest way to get your results is to activate your My Chart account. Instructions are located on the last page of this paperwork. If you have not heard from us regarding the results in 2 weeks, please contact this office.     

## 2017-01-26 ENCOUNTER — Encounter: Payer: Self-pay | Admitting: Physician Assistant

## 2017-01-26 ENCOUNTER — Ambulatory Visit (INDEPENDENT_AMBULATORY_CARE_PROVIDER_SITE_OTHER): Payer: Commercial Managed Care - PPO | Admitting: Physician Assistant

## 2017-01-26 VITALS — BP 122/73 | HR 75 | Temp 97.1°F | Resp 16 | Ht 64.0 in | Wt 158.0 lb

## 2017-01-26 DIAGNOSIS — I10 Essential (primary) hypertension: Secondary | ICD-10-CM | POA: Diagnosis not present

## 2017-01-26 DIAGNOSIS — Z0000 Encounter for general adult medical examination without abnormal findings: Secondary | ICD-10-CM

## 2017-01-26 DIAGNOSIS — Z130 Encounter for screening for diseases of the blood and blood-forming organs and certain disorders involving the immune mechanism: Secondary | ICD-10-CM | POA: Diagnosis not present

## 2017-01-26 DIAGNOSIS — F32A Depression, unspecified: Secondary | ICD-10-CM

## 2017-01-26 DIAGNOSIS — F329 Major depressive disorder, single episode, unspecified: Secondary | ICD-10-CM | POA: Diagnosis not present

## 2017-01-26 DIAGNOSIS — Z1211 Encounter for screening for malignant neoplasm of colon: Secondary | ICD-10-CM | POA: Diagnosis not present

## 2017-01-26 MED ORDER — CITALOPRAM HYDROBROMIDE 40 MG PO TABS: 40.0000 mg | ORAL_TABLET | Freq: Every day | ORAL | 3 refills | Status: DC

## 2017-01-26 MED ORDER — LOSARTAN POTASSIUM 50 MG PO TABS: 50.0000 mg | ORAL_TABLET | Freq: Every day | ORAL | 1 refills | Status: DC

## 2017-01-26 NOTE — Progress Notes (Signed)
PRIMARY CARE AT [facility] [address], [place] [address] [phone]  Date:  01/26/2017   Name:  Jodi Pruitt   DOB:  1956-05-23   MRN:  [id number]  PCP:  Joretta Bachelor, PA    History of Present Illness:  Jodi Pruitt is a 61 y.o. female patient who presents to PCP with  Chief Complaint  Patient presents with  . Annual Exam    med refill, pt would like to change BP meds b/c its making her cough. no PAP     DIET: She is eating frozen entrees which are [employer] which are more organic. She may have a biscuit.  Coffee--3 cups. 1 soda per day  Water intake: 2-3 bottles.    BM: normal.  No blood or black stool.  No constipation or diarrhea.   URINATION: normal.  Dysuria, hematuria, frequency  SLEEP: sleep is fine until cough (see below)  EtOH: 2 drinks per day, cut down since bp issue. Tobacco or vaping: 1ppd since 61 years old   Illicit drug use: none at this time 26s  She is less labile, now that she was off of the celexa.   She feels clammy at work, which is new.  Feels like a hot flash.  No chest pains or palpitaitons.  No sob.  She is coughing with the start of the lisinopril.  She is checking bp at home.   Followed by Dr. Stann Mainland gynecologist--followed.  Mammogram performed by gynecology as well Sister with uterine cancer.    Patient Active Problem List   Diagnosis Date Noted  . Hepatitis C 09/04/2015  . LUQ abdominal pain 09/04/2015  . Simple chronic bronchitis ([place]) 09/04/2015  . Depression 09/04/2015    Past Medical History:  Diagnosis Date  . Anxiety   . Arthritis   . Depression     Past Surgical History:  Procedure Laterality Date  . CESAREAN SECTION    . FRACTURE SURGERY    . NEPHRECTOMY      Social History  Substance Use Topics  . Smoking status: Current Every Day Smoker    Packs/day: 1.50    Years: 40.00    Types: Cigarettes  . Smokeless tobacco: Never Used  . Alcohol use Yes     Comment: DRINK WINE    Family  History  Problem Relation Age of Onset  . Hypertension Mother   . Heart disease Mother   . Hyperlipidemia Mother   . Stroke Mother   . Parkinson's disease Father   . Heart disease Father   . Cancer Sister   . Cancer Maternal Grandmother     Allergies  Allergen Reactions  . Compazine [Prochlorperazine Edisylate] Other (See Comments)    HALLUCINATION    Medication list has been reviewed and updated.  Current Outpatient Prescriptions on File Prior to Visit  Medication Sig Dispense Refill  . citalopram (CELEXA) 20 MG tablet Take 1.5 tablets (30 mg total) by mouth daily. 135 tablet 3  . lisinopril (PRINIVIL,ZESTRIL) 20 MG tablet Take 1 tablet (20 mg total) by mouth daily. 90 tablet 3  . Multiple Vitamin (MULTIVITAMIN) tablet Take 1 tablet by mouth daily. Reported on 09/04/2015    . Naproxen Sodium (ALEVE PO) Take by mouth as needed. Reported on 09/04/2015     No current facility-administered medications on file prior to visit.     ROS ROS otherwise unremarkable unless listed above.  Physical Examination: BP 122/73   Pulse 75   Temp (!) 97.1 F (  36.2 C) (Oral)   Resp 16   Ht 5\' 4"  (1.626 m)   Wt 158 lb (71.7 kg)   SpO2 96%   BMI 27.12 kg/m  Ideal Body Weight: Weight in (lb) to have BMI = 25: 145.3  Physical Exam  Constitutional: She is oriented to person, place, and time. She appears well-developed and well-nourished. No distress.  HENT:  Head: Normocephalic and atraumatic.  Right Ear: External ear normal.  Left Ear: External ear normal.  Eyes: Pupils are equal, round, and reactive to light. Conjunctivae and EOM are normal.  Cardiovascular: Normal rate and regular rhythm.   Pulses:      Radial pulses are 2+ on the right side, and 2+ on the left side.       Dorsalis pedis pulses are 2+ on the right side, and 2+ on the left side.  Varicosities and spider veins of right leg  Pulmonary/Chest: Effort normal. No respiratory distress. She has no decreased breath sounds. She  has no wheezes. She has no rhonchi.  Neurological: She is alert and oriented to person, place, and time.  Skin: She is not diaphoretic.  Psychiatric: She has a normal mood and affect. Her behavior is normal.     Assessment and Plan: Jodi Pruitt is a 61 y.o. female who is here today for physical exam and bp recheck. Stop lisinopril and start losartan.  If there are no side effects, will fill for 6 months (additional 4 months).   She does not want the hep C recheck and possible treatment.  Follow up at next visit.  Liver enzymes obtained today. Refilled celexa at 40mg  daily. Coloscopy ordered today. Annual physical exam - Plan: CBC  Essential hypertension - Plan: losartan (COZAAR) 50 MG tablet  Depression, unspecified depression type  Special screening for malignant neoplasms, colon - Plan: Ambulatory referral to Gastroenterology  Screening for deficiency anemia - Plan: CBC  Ivar Drape, PA-C [facility] 8/6/20188:28 PM

## 2017-01-26 NOTE — Patient Instructions (Addendum)
[facility] [address] [place], [place] [address]   Burnard Leigh [phone]  Horton Finer [phone]  Everardo Beals [phone]   [facility] ([place], [place], [place], [place]) - [phone]  [facility]) - [facility] - [phone]  [facility] [place] - [phone]  [facility]  - [phone] (do not file insurance)   You can also check out psychologytoday.[email or web address] for other therapist.  If you need me to send a referral, just contact me.   Keeping You Healthy  Get These Tests  Blood Pressure- Have your blood pressure checked by your healthcare provider at least once a year.  Normal blood pressure is 120/80.  Weight- Have your body mass index (BMI) calculated to screen for obesity.  BMI is a measure of body fat based on height and weight.  You can calculate your own BMI at GravelBags.it  Cholesterol- Have your cholesterol checked every year.  Diabetes- Have your blood sugar checked every year if you have high blood pressure, high cholesterol, a family history of diabetes or if you are overweight.  Pap Test - Have a pap test every 1 to 5 years if you have been sexually active.  If you are older than 65 and recent pap tests have been normal you may not need additional pap tests.  In addition, if you have had a hysterectomy  for benign disease additional pap tests are not necessary.  Mammogram-Yearly mammograms are essential for early detection of breast cancer  Screening for Colon Cancer- Colonoscopy starting at age 39. Screening may begin sooner depending on your family history and other health conditions.  Follow up colonoscopy as directed by your Gastroenterologist.  Screening for Osteoporosis- Screening begins at age 37 with bone density scanning, sooner  if you are at higher risk for developing Osteoporosis.  Get these medicines  Calcium with Vitamin D- Your body requires 1200-1500 mg of Calcium a day and [phone] IU of Vitamin D a day.  You can only absorb 500 mg of Calcium at a time therefore Calcium must be taken in 2 or 3 separate doses throughout the day.  Hormones- Hormone therapy has been associated with increased risk for certain cancers and heart disease.  Talk to your healthcare provider about if you need relief from menopausal symptoms.  Aspirin- Ask your healthcare provider about taking Aspirin to prevent Heart Disease and Stroke.  Get these Immuniztions  Flu shot- Every fall  Pneumonia shot- Once after the age of 66; if you are younger ask your healthcare provider if you need a pneumonia shot.  Tetanus- Every ten years.  Zostavax- Once after the age of 51 to prevent shingles.  Take these steps  Don't smoke- Your healthcare provider can help you quit. For tips on how to quit, ask your healthcare provider or go to www.smokefree.gov or call 1-800 QUIT-NOW.  Be physically active- Exercise 5 days a week for a minimum of 30 minutes.  If you are not already physically active, start slow and gradually work up to 30 minutes of moderate physical activity.  Try walking, dancing, bike riding, swimming, etc.  Eat a healthy diet- Eat a variety of healthy foods such as fruits, vegetables, whole grains, low fat milk, low fat cheeses, yogurt, lean meats, chicken, fish, eggs, dried beans, tofu, etc.  For more information go to www.thenutritionsource.org  Dental visit- Brush and floss teeth twice daily; visit your  dentist twice a year.  Eye exam- Visit your Optometrist or Ophthalmologist yearly.  Drink alcohol in moderation- Limit alcohol intake to one drink or less a day.  Never drink and drive.  Depression- Your emotional health is as important as your physical health.  If you're feeling down or losing interest in things you normally  enjoy, please talk to your healthcare provider.  Seat Belts- can save your life; always wear one  Smoke/Carbon Monoxide detectors- These detectors need to be installed on the appropriate level of your home.  Replace batteries at least once a year.  Violence- If anyone is threatening or hurting you, please tell your healthcare provider.  Living Will/ Health care power of attorney- Discuss with your healthcare provider and family.    IF you received an x-ray today, you will receive an invoice from [facility]. Please contact [facility] at [phone] with questions or concerns regarding your invoice.   IF you received labwork today, you will receive an invoice from [facility]. Please contact [facility] at [phone] with questions or concerns regarding your invoice.   Our billing staff will not be able to assist you with questions regarding bills from these companies.  You will be contacted with the lab results as soon as they are available. The fastest way to get your results is to activate your My Chart account. Instructions are located on the last page of this paperwork. If you have not heard from [place] regarding the results in 2 weeks, please contact this office.

## 2017-02-05 ENCOUNTER — Telehealth: Payer: Self-pay

## 2017-02-26 ENCOUNTER — Other Ambulatory Visit: Payer: Self-pay | Admitting: Physician Assistant

## 2017-03-02 ENCOUNTER — Encounter: Payer: Self-pay | Admitting: Physician Assistant

## 2017-03-24 ENCOUNTER — Other Ambulatory Visit: Payer: Self-pay | Admitting: Physician Assistant

## 2017-03-24 DIAGNOSIS — I10 Essential (primary) hypertension: Secondary | ICD-10-CM

## 2017-03-24 NOTE — Telephone Encounter (Signed)
Refill request for losartan 50 mg #30 with 3 refill approved. Dgaddy, CMA

## 2017-09-23 ENCOUNTER — Encounter: Payer: Self-pay | Admitting: Physician Assistant

## 2017-12-14 ENCOUNTER — Other Ambulatory Visit: Payer: Self-pay | Admitting: Family Medicine

## 2017-12-14 DIAGNOSIS — I10 Essential (primary) hypertension: Secondary | ICD-10-CM

## 2017-12-15 ENCOUNTER — Ambulatory Visit: Payer: Self-pay | Admitting: Physician Assistant

## 2017-12-15 NOTE — Telephone Encounter (Signed)
Feels more foggy headed than woozey . Pt is awake and alert  oriented . Missed a dose of her bp med  several days ago. Appt made with Dr Pamella Pert for tomorrow. Pt advised precautions.  Reason for Disposition . Systolic BP  >= 959 OR Diastolic >= 747  Answer Assessment - Initial Assessment Questions 1. BLOOD PRESSURE: "What is the blood pressure?" "Did you take at least two measurements 5 minutes apart?"     168/106  172/105   Pulse 75   2. ONSET: "When did you take your blood pressure?"    1130   3. HOW: "How did you obtain the blood pressure?" (e.g., visiting nurse, automatic home BP monitor)     Automatic machine  4. HISTORY: "Do you have a history of high blood pressure?"     yes 5. MEDICATIONS: "Are you taking any medications for blood pressure?" "Have you missed any doses recently?"      Missed dose 2 days ago   6. OTHER SYMPTOMS: "Do you have any symptoms?" (e.g., headache, chest pain, blurred vision, difficulty breathing, weakness)        Headache this am  Went away  Feels slightly woozy    7. PREGNANCY: "Is there any chance you are pregnant?" "When was your last menstrual period?"     n/a  Protocols used: HIGH BLOOD PRESSURE-A-AH

## 2017-12-16 ENCOUNTER — Encounter: Payer: Self-pay | Admitting: Family Medicine

## 2017-12-16 ENCOUNTER — Other Ambulatory Visit: Payer: Self-pay

## 2017-12-16 ENCOUNTER — Ambulatory Visit: Payer: Commercial Managed Care - PPO | Admitting: Family Medicine

## 2017-12-16 VITALS — BP 124/82 | HR 90 | Temp 98.7°F | Ht 63.0 in | Wt 149.4 lb

## 2017-12-16 DIAGNOSIS — Z23 Encounter for immunization: Secondary | ICD-10-CM

## 2017-12-16 DIAGNOSIS — I10 Essential (primary) hypertension: Secondary | ICD-10-CM | POA: Diagnosis not present

## 2017-12-16 DIAGNOSIS — Z1231 Encounter for screening mammogram for malignant neoplasm of breast: Secondary | ICD-10-CM | POA: Diagnosis not present

## 2017-12-16 DIAGNOSIS — B182 Chronic viral hepatitis C: Secondary | ICD-10-CM

## 2017-12-16 NOTE — Patient Instructions (Signed)
     IF you received an x-ray today, you will receive an invoice from [facility]. Please contact [facility] at [phone] with questions or concerns regarding your invoice.   IF you received labwork today, you will receive an invoice from [facility]. Please contact [facility] at [phone] with questions or concerns regarding your invoice.   Our billing staff will not be able to assist you with questions regarding bills from these companies.  You will be contacted with the lab results as soon as they are available. The fastest way to get your results is to activate your My Chart account. Instructions are located on the last page of this paperwork. If you have not heard from us regarding the results in 2 weeks, please contact this office.     

## 2017-12-16 NOTE — Progress Notes (Signed)
6/26/201912:17 PM  Jodi Pruitt Nov 03, 1955, 62 y.o. female [id number]  Chief Complaint  Patient presents with  . Hypertension    has recordings of bp's monitored atr home, brought in bp cuff. Seems to be reading higher than it should vs in office cuff    HPI:   Patient is a 62 y.o. female with past medical history significant for HTN and chronic hep C who presents today for concerns of elevated BP readings at home  She has been stable on losartan 50mg  daily for a while now She normally monitors bp at home Of recent she has been having elevated readings of 160/90s She brings in her cuff to have to it checked  She also is requesting surveillance of hepatitis c Has had it since her early 49s she believes She looked into treatment several years ago and could not afford it  She is due for her mammogram and Td booster Last mammogram several years ago, denies any breast concerns  She has no acute concerns today  Fall Risk  12/16/2017 01/26/2017 12/30/2016  Falls in the past year? No No No     Depression screen [facility] 2/9 12/16/2017 01/26/2017 12/30/2016  Decreased Interest 0 0 0  Down, Depressed, Hopeless 0 0 0  PHQ - 2 Score 0 0 0    Allergies  Allergen Reactions  . Compazine [Prochlorperazine Edisylate] Other (See Comments)    HALLUCINATION    Prior to Admission medications   Medication Sig Start Date End Date Taking? Authorizing Provider  citalopram (CELEXA) 40 MG tablet Take 1 tablet (40 mg total) by mouth daily. 01/26/17   Ivar Drape D, PA  losartan (COZAAR) 50 MG tablet TAKE 1 TABLET BY MOUTH DAILY 12/15/17   Forrest Moron, MD  Multiple Vitamin (MULTIVITAMIN) tablet Take 1 tablet by mouth daily. Reported on 09/04/2015    [provider]  Naproxen Sodium (ALEVE PO) Take by mouth as needed. Reported on 09/04/2015    [provider]    Past Medical History:  Diagnosis Date  . Anxiety   . Arthritis   . Depression     Past Surgical  History:  Procedure Laterality Date  . CESAREAN SECTION    . FRACTURE SURGERY    . NEPHRECTOMY      Social History   Tobacco Use  . Smoking status: Current Every Day Smoker    Packs/day: 1.50    Years: 40.00    Pack years: 60.00    Types: Cigarettes  . Smokeless tobacco: Never Used  Substance Use Topics  . Alcohol use: Yes    Comment: DRINK WINE    Family History  Problem Relation Age of Onset  . Hypertension Mother   . Heart disease Mother   . Hyperlipidemia Mother   . Stroke Mother   . Parkinson's disease Father   . Heart disease Father   . Cancer Sister   . Cancer Maternal Grandmother     Review of Systems  Constitutional: Negative for chills and fever.  Respiratory: Negative for cough and shortness of breath.   Cardiovascular: Negative for chest pain, palpitations and leg swelling.  Gastrointestinal: Negative for abdominal pain, nausea and vomiting.     OBJECTIVE:  Blood pressure 124/82, pulse 90, temperature 98.7 F (37.1 C), temperature source Oral, height 5\' 3"  (1.6 m), weight 149 lb 6.4 oz (67.8 kg), SpO2 96 %.  Physical Exam  Constitutional: She is oriented to person, place, and time. She appears well-developed and well-nourished.  HENT:  Head: Normocephalic and atraumatic.  Mouth/Throat: Mucous membranes are normal.  Eyes: Pupils are equal, round, and reactive to light. EOM are normal. No scleral icterus.  Neck: Neck supple.  Pulmonary/Chest: Effort normal.  Neurological: She is alert and oriented to person, place, and time.  Skin: Skin is warm and dry.  Psychiatric: She has a normal mood and affect.  Nursing note and vitals reviewed.   ASSESSMENT and PLAN  1. Essential hypertension Controlled. Continue current regime. Her BP cuff not calibrated.  - Comprehensive metabolic panel; Future - TSH; Future - Lipid panel; Future  2. Visit for screening mammogram - MM DIGITAL SCREENING BILATERAL; Future  3. Need for prophylactic vaccination  with combined diphtheria-tetanus-pertussis (DTP) vaccine - Td vaccine greater than or equal to 7yo preservative free IM  4. Chronic hepatitis C without hepatic coma ([place]) Discussed exploring  Treatment options again. Otherwise [place] surveillance. Need to discuss hep a and b vaccines - US Abdomen Limited RUQ; Future - CBC with Differential/Platelet; Future - Comprehensive metabolic panel; Future - Hepatitis c antibody (reflex); Future - AFP tumor marker; Future - Protime-INR; Future - Ambulatory referral to Infectious Disease  All labs and studies to be done at [facility]. Lab orders given to patient.  Return for annual physical.    Rutherford Guys, MD Primary Care at [place], [place] [address] Ph.  [phone] Fax [phone]

## 2017-12-23 ENCOUNTER — Telehealth: Payer: Self-pay | Admitting: Family Medicine

## 2017-12-23 NOTE — Telephone Encounter (Signed)
[facility] to get pt U/S scheduled. They said they are needing order changed to a Gallbladder right upper quadrant U/S. They went ahead and scheduled pt for 01/04/18 at 8:30am, but will need new order faxed to them before this to fax number [phone]. Pt is to be NPO for 6hrs prior to appt.They will also need this order signed by MD. I also left vm for pt that she can call [facility] to get her mammogram scheduled. Please advise. Thanks!

## 2017-12-28 NOTE — Telephone Encounter (Signed)
That is the problem we have nothing like that in epic...nothing that states gallbladder ultrasound, we only have RUQ abdominal ultrasound. Can patient come in a pickup hand written order? I will leave it at 102. Please call patient to pickup. thanks

## 2017-12-28 NOTE — Telephone Encounter (Signed)
Spoke with Jodi Pruitt at [facility], she said they did receive fax but are unable to read it and it has void written on it. If we can place the order via Epic, I can print and get provider to sign this and then fax order. I also left pt another vm with appt date and time on it. Please advise. Thanks!

## 2017-12-28 NOTE — Telephone Encounter (Signed)
I faxed a handwritten order on Friday, can you please followup with this and make sure it works. thanks

## 2017-12-31 NOTE — Telephone Encounter (Signed)
Will call [facility] tomorrow to see if pt can take hand written order to appt. I did not see this in 102 box, however. Pt's appt is 01/04/18. Please advise.

## 2018-01-01 NOTE — Telephone Encounter (Signed)
Spoke with [facility] and they are okay with a written order to be faxed to them as long as it has test name, diagnosis, pt name and dob, and signed by a provider. Order written by Kalman Shan under letters in Epic and signed by PA Philis Fendt. Faxed to [facility] at [phone] on 7/12. Thanks!

## 2018-01-01 NOTE — Telephone Encounter (Signed)
Jodi Pruitt called [facility] to make sure they received order and everything is okay for pt's appointment. They stated pt called and canceled appt but they did not have a reason as to why.

## 2018-01-03 NOTE — Telephone Encounter (Signed)
noted 

## 2018-01-11 ENCOUNTER — Other Ambulatory Visit: Payer: Self-pay | Admitting: Family Medicine

## 2018-01-11 DIAGNOSIS — I10 Essential (primary) hypertension: Secondary | ICD-10-CM

## 2018-01-11 NOTE — Telephone Encounter (Signed)
Refill request for losartan 50 mg #30 with 0 refills approved.  Pt needs to schedule appt with [facility].  Will send to schedulers to call pt and setup ov with [facility]. Dgaddy, CMA

## 2018-01-27 LAB — PROTIME-INR: INR: 1 (ref 0.9–1.1)

## 2018-01-29 ENCOUNTER — Telehealth: Payer: Self-pay

## 2018-01-29 NOTE — Telephone Encounter (Signed)
Richard with [facility] reporting positive Hep C test. Will fax to practice.

## 2018-02-02 ENCOUNTER — Ambulatory Visit (INDEPENDENT_AMBULATORY_CARE_PROVIDER_SITE_OTHER): Payer: Commercial Managed Care - PPO | Admitting: Family Medicine

## 2018-02-02 ENCOUNTER — Encounter: Payer: Self-pay | Admitting: Family Medicine

## 2018-02-02 ENCOUNTER — Other Ambulatory Visit: Payer: Self-pay

## 2018-02-02 VITALS — BP 132/74 | HR 85 | Temp 99.1°F | Ht 63.0 in | Wt 155.0 lb

## 2018-02-02 DIAGNOSIS — Z0000 Encounter for general adult medical examination without abnormal findings: Secondary | ICD-10-CM | POA: Diagnosis not present

## 2018-02-02 DIAGNOSIS — I10 Essential (primary) hypertension: Secondary | ICD-10-CM | POA: Diagnosis not present

## 2018-02-02 DIAGNOSIS — B182 Chronic viral hepatitis C: Secondary | ICD-10-CM

## 2018-02-02 DIAGNOSIS — F418 Other specified anxiety disorders: Secondary | ICD-10-CM

## 2018-02-02 MED ORDER — SERTRALINE HCL 100 MG PO TABS: 100.0000 mg | ORAL_TABLET | Freq: Every day | ORAL | 1 refills | Status: DC

## 2018-02-02 MED ORDER — LOSARTAN POTASSIUM 50 MG PO TABS: 50.0000 mg | ORAL_TABLET | Freq: Every day | ORAL | 1 refills | Status: DC

## 2018-02-02 NOTE — Patient Instructions (Addendum)
   IF you received an x-ray today, you will receive an invoice from [facility]. Please contact [facility] at [phone] with questions or concerns regarding your invoice.   IF you received labwork today, you will receive an invoice from [facility]. Please contact [facility] at [phone] with questions or concerns regarding your invoice.   Our billing staff will not be able to assist you with questions regarding bills from these companies.  You will be contacted with the lab results as soon as they are available. The fastest way to get your results is to activate your My Chart account. Instructions are located on the last page of this paperwork. If you have not heard from us regarding the results in 2 weeks, please contact this office.     Preventive Care 40-64 Years, Female Preventive care refers to lifestyle choices and visits with your health care provider that can promote health and wellness. What does preventive care include?  A yearly physical exam. This is also called an annual well check.  Dental exams once or twice a year.  Routine eye exams. Ask your health care provider how often you should have your eyes checked.  Personal lifestyle choices, including: ? Daily care of your teeth and gums. ? Regular physical activity. ? Eating a healthy diet. ? Avoiding tobacco and drug use. ? Limiting alcohol use. ? Practicing safe sex. ? Taking low-dose aspirin daily starting at age 50. ? Taking vitamin and mineral supplements as recommended by your health care provider. What happens during an annual well check? The services and screenings done by your health care provider during your annual well check will depend on your age, overall health, lifestyle risk factors, and family history of disease. Counseling Your health care provider may ask you questions about your:  Alcohol use.  Tobacco use.  Drug use.  Emotional well-being.  Home and relationship  well-being.  Sexual activity.  Eating habits.  Work and work environment.  Method of birth control.  Menstrual cycle.  Pregnancy history.  Screening You may have the following tests or measurements:  Height, weight, and BMI.  Blood pressure.  Lipid and cholesterol levels. These may be checked every 5 years, or more frequently if you are over 50 years old.  Skin check.  Lung cancer screening. You may have this screening every year starting at age 55 if you have a 30-pack-year history of smoking and currently smoke or have quit within the past 15 years.  Fecal occult blood test (FOBT) of the stool. You may have this test every year starting at age 50.  Flexible sigmoidoscopy or colonoscopy. You may have a sigmoidoscopy every 5 years or a colonoscopy every 10 years starting at age 50.  Hepatitis C blood test.  Hepatitis B blood test.  Sexually transmitted disease (STD) testing.  Diabetes screening. This is done by checking your blood sugar (glucose) after you have not eaten for a while (fasting). You may have this done every 1-3 years.  Mammogram. This may be done every 1-2 years. Talk to your health care provider about when you should start having regular mammograms. This may depend on whether you have a family history of breast cancer.  BRCA-related cancer screening. This may be done if you have a family history of breast, ovarian, tubal, or peritoneal cancers.  Pelvic exam and Pap test. This may be done every 3 years starting at age 21. Starting at age 30, this may be done every 5 years if you   have a Pap test in combination with an HPV test.  Bone density scan. This is done to screen for osteoporosis. You may have this scan if you are at high risk for osteoporosis.  Discuss your test results, treatment options, and if necessary, the need for more tests with your health care provider. Vaccines Your health care provider may recommend certain vaccines, such  as:  Influenza vaccine. This is recommended every year.  Tetanus, diphtheria, and acellular pertussis (Tdap, Td) vaccine. You may need a Td booster every 10 years.  Varicella vaccine. You may need this if you have not been vaccinated.  Zoster vaccine. You may need this after age 60.  Measles, mumps, and rubella (MMR) vaccine. You may need at least one dose of MMR if you were born in 1957 or later. You may also need a second dose.  Pneumococcal 13-valent conjugate (PCV13) vaccine. You may need this if you have certain conditions and were not previously vaccinated.  Pneumococcal polysaccharide (PPSV23) vaccine. You may need one or two doses if you smoke cigarettes or if you have certain conditions.  Meningococcal vaccine. You may need this if you have certain conditions.  Hepatitis A vaccine. You may need this if you have certain conditions or if you travel or work in places where you may be exposed to hepatitis A.  Hepatitis B vaccine. You may need this if you have certain conditions or if you travel or work in places where you may be exposed to hepatitis B.  Haemophilus influenzae type b (Hib) vaccine. You may need this if you have certain conditions.  Talk to your health care provider about which screenings and vaccines you need and how often you need them. This information is not intended to replace advice given to you by your health care provider. Make sure you discuss any questions you have with your health care provider. Document Released: 07/06/2015 Document Revised: 02/27/2016 Document Reviewed: 04/10/2015 Elsevier Interactive Patient Education  2018 Elsevier Inc.  

## 2018-02-02 NOTE — Progress Notes (Signed)
8/13/201911:47 AM  Jodi Pruitt [birth year], 62 y.o. female [id number]  Chief Complaint  Patient presents with  . Annual Exam    HPI:   Patient is a 62 y.o. female with past medical history significant for HCV, HTN, depresion and anxiety who presents today for CPE  Cervical Cancer Screening: 08/2015 Breast Cancer Screening: has upcoming appt Colorectal Cancer Screening: at age 73 Bone Density Testing: at age 23 Seasonal Influenza Vaccination: gets at work Td/Tdap Vaccination: gets at work Pneumococcal Vaccination: at age 89 Frequency of Dental evaluation: Q6 months Frequency of Eye evaluation: wears eyeglasses  Drinking a bit more than normal, about 3 weeks a day, Recently though feeling better, accomplished several projects Adult son lives with her, has mental illness Having more issues with anxiety of recent Has been on celexa for about 10 years Was on zoloft for years, did well on it Tried wellbutrin and remembers not doing well on it.  Has upcoming appt with ID for hepatitis C  Fall Risk  02/02/2018 12/16/2017 01/26/2017 12/30/2016  Falls in the past year? No No No No     Depression screen [facility] 2/9 02/02/2018 12/16/2017 01/26/2017  Decreased Interest 0 0 0  Down, Depressed, Hopeless 0 0 0  PHQ - 2 Score 0 0 0    Allergies  Allergen Reactions  . Compazine [Prochlorperazine Edisylate] Other (See Comments)    HALLUCINATION    Prior to Admission medications   Medication Sig Start Date End Date Taking? Authorizing Provider  citalopram (CELEXA) 40 MG tablet Take 1 tablet (40 mg total) by mouth daily. 01/26/17  Yes English, Colletta Maryland D, PA  losartan (COZAAR) 50 MG tablet TAKE 1 TABLET BY MOUTH DAILY 01/11/18  Yes Delia Chimes A, MD  Multiple Vitamin (MULTIVITAMIN) tablet Take 1 tablet by mouth daily. Reported on 09/04/2015   Yes [provider]  Naproxen Sodium (ALEVE PO) Take by mouth as needed. Reported on 09/04/2015   Yes [provider]     Past Medical History:  Diagnosis Date  . Anxiety   . Arthritis   . Depression   . Hepatitis C, chronic ([place])   . HTN (hypertension)     Past Surgical History:  Procedure Laterality Date  . CESAREAN SECTION    . FRACTURE SURGERY    . NEPHRECTOMY      Social History   Tobacco Use  . Smoking status: Current Every Day Smoker    Packs/day: 1.50    Years: 40.00    Pack years: 60.00    Types: Cigarettes  . Smokeless tobacco: Never Used  Substance Use Topics  . Alcohol use: Yes    Comment: DRINK WINE    Family History  Problem Relation Age of Onset  . Hypertension Mother   . Heart disease Mother   . Hyperlipidemia Mother   . Stroke Mother   . Parkinson's disease Father   . Heart disease Father   . Cancer Sister   . Cancer Maternal Grandmother     Review of Systems  Constitutional: Negative for chills and fever.  Respiratory: Negative for cough and shortness of breath.   Cardiovascular: Negative for chest pain, palpitations and leg swelling.  Gastrointestinal: Negative for abdominal pain, nausea and vomiting.  Genitourinary: Negative for dysuria and hematuria.  Musculoskeletal: Negative for joint pain and myalgias.  Psychiatric/Behavioral: Positive for depression. Negative for suicidal ideas. The patient is nervous/anxious and has insomnia.   All other systems reviewed and are negative.    OBJECTIVE:  Blood pressure 132/74, pulse 85, temperature 99.1 F (37.3 C), temperature source Oral, height 5\' 3"  (1.6 m), weight 155 lb (70.3 kg), SpO2 97 %. Body mass index is 27.46 kg/m.   Physical Exam  Constitutional: She is oriented to person, place, and time. She appears well-developed and well-nourished.  HENT:  Head: Normocephalic and atraumatic.  Right Ear: Hearing, tympanic membrane, external ear and ear canal normal.  Left Ear: Hearing, tympanic membrane, external ear and ear canal normal.  Mouth/Throat: Oropharynx is clear and moist.  Eyes: Pupils are  equal, round, and reactive to light. Conjunctivae and EOM are normal.  Neck: Neck supple. No thyromegaly present.  Cardiovascular: Normal rate, regular rhythm, normal heart sounds and intact distal pulses. Exam reveals no gallop and no friction rub.  No murmur heard. Pulmonary/Chest: Effort normal and breath sounds normal. She has no wheezes. She has no rales.  Abdominal: Soft. Bowel sounds are normal. She exhibits no distension and no mass. There is no tenderness.  Musculoskeletal: Normal range of motion. She exhibits no edema.  Lymphadenopathy:    She has no cervical adenopathy.  Neurological: She is alert and oriented to person, place, and time. She has normal reflexes. No cranial nerve deficit. Gait normal.  Skin: Skin is warm and dry.  Psychiatric: She has a normal mood and affect.  Nursing note and vitals reviewed.   ASSESSMENT and PLAN  1. Annual physical exam HCM labs already done at outside labs, pending receipt. Otherwise discussed age appropriate HCM and anticipatory guidance.  2. Chronic hepatitis C without hepatic coma ([place]) Has upcoming appt with ID. Discussed concerns for etoh and HCV. Strongly encouraged cutting back/quitting.  3. Essential hypertension Controlled. Continue current regime.    4. Depression with anxiety Uncontrolled. Feels she has never done as well on celexa. Would like to change back to zoloft. Will titrate as needed. Discussed contribution/cyclel with etoh. Declines any other treatments at this time  Return in about 3 months (around 05/05/2018).    Rutherford Guys, MD Primary Care at [place], [place] [address] Ph.  [phone] Fax [phone]

## 2018-02-08 ENCOUNTER — Encounter: Payer: Self-pay | Admitting: Family

## 2018-02-08 NOTE — Progress Notes (Deleted)
Subjective:    Patient ID: Jodi Pruitt, female    DOB: 1956/03/24, 62 y.o.   MRN: [id number]  No chief complaint on file.   HPI:  Jodi Pruitt is a 62 y.o. female who presents today for initial office visit for evaluation of Hepatitis C.   [name] was first diagnosed with Hepatitis C in March of 2017 when she had a positive Hepatitis C antibody test with a corresponding viral load of 8.2 million. Her risk factors for Hepatitis .   Allergies  Allergen Reactions  . Compazine [Prochlorperazine Edisylate] Other (See Comments)    HALLUCINATION      Outpatient Medications Prior to Visit  Medication Sig Dispense Refill  . losartan (COZAAR) 50 MG tablet Take 1 tablet (50 mg total) by mouth daily. 90 tablet 1  . Multiple Vitamin (MULTIVITAMIN) tablet Take 1 tablet by mouth daily. Reported on 09/04/2015    . Naproxen Sodium (ALEVE PO) Take by mouth as needed. Reported on 09/04/2015    . sertraline (ZOLOFT) 100 MG tablet Take 1 tablet (100 mg total) by mouth daily. 90 tablet 1   No facility-administered medications prior to visit.      Past Medical History:  Diagnosis Date  . Anxiety   . Arthritis   . Depression   . Hepatitis C, chronic ([place])   . HTN (hypertension)       Past Surgical History:  Procedure Laterality Date  . CESAREAN SECTION    . FRACTURE SURGERY    . NEPHRECTOMY        Family History  Problem Relation Age of Onset  . Hypertension Mother   . Heart disease Mother   . Hyperlipidemia Mother   . Stroke Mother   . Parkinson's disease Father   . Heart disease Father   . Cancer Sister   . Cancer Maternal Grandmother       Social History   Socioeconomic History  . Marital status: Unknown    Spouse name: Not on file  . Number of children: Not on file  . Years of education: Not on file  . Highest education level: Not on file  Occupational History  . Not on file  Social Needs  . Financial resource strain: Not on  file  . Food insecurity:    Worry: Not on file    Inability: Not on file  . Transportation needs:    Medical: Not on file    Non-medical: Not on file  Tobacco Use  . Smoking status: Current Every Day Smoker    Packs/day: 1.50    Years: 40.00    Pack years: 60.00    Types: Cigarettes  . Smokeless tobacco: Never Used  Substance and Sexual Activity  . Alcohol use: Yes    Comment: DRINK WINE  . Drug use: Never  . Sexual activity: Yes  Lifestyle  . Physical activity:    Days per week: Not on file    Minutes per session: Not on file  . Stress: Not on file  Relationships  . Social connections:    Talks on phone: Not on file    Gets together: Not on file    Attends religious service: Not on file    Active member of club or organization: Not on file    Attends meetings of clubs or organizations: Not on file    Relationship status: Not on file  . Intimate partner violence:    Fear of current or ex partner: Not on  file    Emotionally abused: Not on file    Physically abused: Not on file    Forced sexual activity: Not on file  Other Topics Concern  . Not on file  Social History Narrative  . Not on file      Review of Systems  Constitutional: Negative for chills, fatigue, fever and unexpected weight change.  Respiratory: Negative for cough, chest tightness, shortness of breath and wheezing.   Cardiovascular: Negative for chest pain and leg swelling.  Gastrointestinal: Negative for abdominal distention, constipation, diarrhea, nausea and vomiting.  Neurological: Negative for dizziness, weakness, light-headedness and headaches.  Hematological: Does not bruise/bleed easily.       Objective:    There were no vitals taken for this visit. Nursing note and vital signs reviewed.  Physical Exam  Constitutional: She is oriented to person, place, and time. She appears well-developed. No distress.  Cardiovascular: Normal rate, regular rhythm, normal heart sounds and intact distal  pulses. Exam reveals no gallop and no friction rub.  No murmur heard. Pulmonary/Chest: Effort normal and breath sounds normal. No respiratory distress. She has no wheezes. She has no rales. She exhibits no tenderness.  Abdominal: Soft. Bowel sounds are normal. She exhibits no distension, no ascites and no mass. There is no hepatosplenomegaly. There is no tenderness. There is no rebound and no guarding.  Neurological: She is alert and oriented to person, place, and time.  Skin: Skin is warm and dry.  Psychiatric: She has a normal mood and affect. Her behavior is normal. Judgment and thought content normal.        Assessment & Plan:   Problem List Items Addressed This Visit      Digestive   Chronic hepatitis C without hepatic coma ([place]) - Primary       I am having [name] maintain her Naproxen Sodium (ALEVE PO), multivitamin, losartan, and sertraline.   No orders of the defined types were placed in this encounter.    Follow-up: No follow-ups on file.    Terri Piedra, MSN, FNP-C Nurse Practitioner [facility] Office phone: [phone] Pager: [place] number: [phone]

## 2018-02-12 ENCOUNTER — Other Ambulatory Visit: Payer: Self-pay | Admitting: Family Medicine

## 2018-02-12 DIAGNOSIS — I10 Essential (primary) hypertension: Secondary | ICD-10-CM

## 2018-03-18 NOTE — Telephone Encounter (Signed)
Sign encounter

## 2018-05-07 ENCOUNTER — Ambulatory Visit (INDEPENDENT_AMBULATORY_CARE_PROVIDER_SITE_OTHER): Payer: Commercial Managed Care - PPO | Admitting: Family Medicine

## 2018-05-07 ENCOUNTER — Ambulatory Visit (INDEPENDENT_AMBULATORY_CARE_PROVIDER_SITE_OTHER): Payer: Commercial Managed Care - PPO

## 2018-05-07 ENCOUNTER — Encounter: Payer: Self-pay | Admitting: Family Medicine

## 2018-05-07 VITALS — BP 138/78 | HR 83 | Temp 99.4°F | Resp 14 | Wt 152.0 lb

## 2018-05-07 DIAGNOSIS — F418 Other specified anxiety disorders: Secondary | ICD-10-CM | POA: Diagnosis not present

## 2018-05-07 DIAGNOSIS — I10 Essential (primary) hypertension: Secondary | ICD-10-CM | POA: Diagnosis not present

## 2018-05-07 DIAGNOSIS — R059 Cough, unspecified: Secondary | ICD-10-CM

## 2018-05-07 DIAGNOSIS — R05 Cough: Secondary | ICD-10-CM

## 2018-05-07 DIAGNOSIS — Z789 Other specified health status: Secondary | ICD-10-CM

## 2018-05-07 LAB — POCT CBC
Granulocyte percent: 58.5 %G (ref 37–80)
HCT, POC: 44.7 % — AB (ref 29–41)
Hemoglobin: 15.6 g/dL — AB (ref 9.5–13.5)
Lymph, poc: 2.9 (ref 0.6–3.4)
MCH, POC: 32.7 pg — AB (ref 27–31.2)
MCHC: 34.9 g/dL (ref 31.8–35.4)
MCV: 93.7 fL (ref 76–111)
MID (cbc): 0.6 (ref 0–0.9)
MPV: 6.2 fL (ref 0–99.8)
POC Granulocyte: 4.9 (ref 2–6.9)
POC LYMPH PERCENT: 34.3 %L (ref 10–50)
POC MID %: 7.2 %M (ref 0–12)
Platelet Count, POC: 376 10*3/uL (ref 142–424)
RBC: 4.77 M/uL (ref 4.04–5.48)
RDW, POC: 13 %
WBC: 8.4 10*3/uL (ref 4.6–10.2)

## 2018-05-07 MED ORDER — SERTRALINE HCL 100 MG PO TABS: 150.0000 mg | ORAL_TABLET | Freq: Every day | ORAL | 0 refills | Status: DC

## 2018-05-07 MED ORDER — ARIPIPRAZOLE 5 MG PO TABS: 5.0000 mg | ORAL_TABLET | Freq: Every day | ORAL | 2 refills | Status: DC

## 2018-05-07 NOTE — Progress Notes (Signed)
11/15/20198:17 AM  Jodi Pruitt 06-Jul-1955, 62 y.o. female [id number]  Chief Complaint  Patient presents with  . Follow-up    the medication do not seems like it is working    HPI:   Patient is a 62 y.o. female with past medical history significant for HCV, HTN, depresion and anxiety who presents today for routine followup  Last visit changed from celexa to zoloft 100mg  Discussed etoh contribution to mood disorder Feels that zoloft is not working, worse than when she was on celexa Also wonders if decreased urine stream is related to zoloft PHQ9 and GAD 7 noted Reports not drinking daily, but still drinks > 2 drinks at a time, most days a week Denies active SI Continues to struggle with her son Has only tried celexa, zoloft and wellbutrin  A week and half of worsening productive cough Has been doing expectorant and acapella and still feels she cant cough up her secretions No SOB, but feels weak and light headed Reports PND but denies any sinus pain/pressure It started after a cold No fevers since the first couple of days Has not had to take time off from work smoker  Fall Risk  02/02/2018 12/16/2017 01/26/2017 12/30/2016  Falls in the past year? No No No No     Depression screen [facility] 2/9 05/07/2018 02/02/2018 12/16/2017  Decreased Interest 3 0 0  Down, Depressed, Hopeless 3 0 0  PHQ - 2 Score 6 0 0  Altered sleeping 3 - -  Tired, decreased energy 2 - -  Change in appetite 3 - -  Feeling bad or failure about yourself  3 - -  Trouble concentrating 2 - -  Moving slowly or fidgety/restless 2 - -  Suicidal thoughts 2 - -  PHQ-9 Score 23 - -  Difficult doing work/chores Very difficult - -   GAD 7 : Generalized Anxiety Score 05/07/2018  Nervous, Anxious, on Edge 3  Control/stop worrying 2  Worry too much - different things 2  Trouble relaxing 2  Restless 2  Easily annoyed or irritable 2  Afraid - awful might happen 2  Total GAD 7 Score 15  Anxiety Difficulty  Very difficult     Allergies  Allergen Reactions  . Compazine [Prochlorperazine Edisylate] Other (See Comments)    HALLUCINATION    Prior to Admission medications   Medication Sig Start Date End Date Taking? Authorizing Provider  losartan (COZAAR) 50 MG tablet Take 1 tablet (50 mg total) by mouth daily. 02/02/18  Yes Rutherford Guys, MD  losartan (COZAAR) 50 MG tablet TAKE 1 TABLET BY MOUTH DAILY 02/12/18  Yes Rutherford Guys, MD  Multiple Vitamin (MULTIVITAMIN) tablet Take 1 tablet by mouth daily. Reported on 09/04/2015   Yes [provider]  Naproxen Sodium (ALEVE PO) Take by mouth as needed. Reported on 09/04/2015   Yes [provider]  sertraline (ZOLOFT) 100 MG tablet Take 1 tablet (100 mg total) by mouth daily. 02/02/18  Yes Rutherford Guys, MD    Past Medical History:  Diagnosis Date  . Anxiety   . Arthritis   . Depression   . Hepatitis C, chronic ([place])   . HTN (hypertension)     Past Surgical History:  Procedure Laterality Date  . CESAREAN SECTION    . FRACTURE SURGERY    . NEPHRECTOMY      Social History   Tobacco Use  . Smoking status: Current Every Day Smoker    Packs/day: 1.50  Years: 40.00    Pack years: 60.00    Types: Cigarettes  . Smokeless tobacco: Never Used  Substance Use Topics  . Alcohol use: Yes    Comment: DRINK WINE    Family History  Problem Relation Age of Onset  . Hypertension Mother   . Heart disease Mother   . Hyperlipidemia Mother   . Stroke Mother   . Parkinson's disease Father   . Heart disease Father   . Cancer Sister   . Cancer Maternal Grandmother     ROS Per hpi  OBJECTIVE:  Blood pressure 138/78, pulse 83, temperature 99.4 F (37.4 C), temperature source Oral, resp. rate 14, weight 152 lb (68.9 kg), SpO2 96 %. Body mass index is 26.93 kg/m.   BP Readings from Last 3 Encounters:  05/07/18 138/78  02/02/18 132/74  12/16/17 124/82    Physical Exam  Constitutional: She is oriented to  person, place, and time. She appears well-developed and well-nourished.  HENT:  Head: Normocephalic and atraumatic.  Right Ear: Hearing, tympanic membrane, external ear and ear canal normal.  Left Ear: Hearing, tympanic membrane, external ear and ear canal normal.  Mouth/Throat: Oropharynx is clear and moist.  Eyes: Pupils are equal, round, and reactive to light. Conjunctivae and EOM are normal.  Neck: Neck supple.  Cardiovascular: Normal rate, regular rhythm and normal heart sounds. Exam reveals no gallop and no friction rub.  No murmur heard. Pulmonary/Chest: Effort normal. She has no wheezes. She has rhonchi in the right lower field. She has no rales.  Musculoskeletal: She exhibits no edema.  Lymphadenopathy:    She has no cervical adenopathy.  Neurological: She is alert and oriented to person, place, and time.  Skin: Skin is warm and dry.  Psychiatric: Her affect is blunt.  tearful  Nursing note and vitals reviewed.   Results for orders placed or performed in visit on 05/07/18 (from the past 24 hour(s))  POCT CBC     Status: Abnormal   Collection Time: 05/07/18  8:53 AM  Result Value Ref Range   WBC 8.4 4.6 - 10.2 K/uL   Lymph, poc 2.9 0.6 - 3.4   POC LYMPH PERCENT 34.3 10 - 50 %L   MID (cbc) 0.6 0 - 0.9   POC MID % 7.2 0 - 12 %M   POC Granulocyte 4.9 2 - 6.9   Granulocyte percent 58.5 37 - 80 %G   RBC 4.77 4.04 - 5.48 M/uL   Hemoglobin 15.6 (A) 9.5 - 13.5 g/dL   HCT, POC 44.7 (A) 29 - 41 %   MCV 93.7 76 - 111 fL   MCH, POC 32.7 (A) 27 - 31.2 pg   MCHC 34.9 31.8 - 35.4 g/dL   RDW, POC 13.0 %   Platelet Count, POC 376 142 - 424 K/uL   MPV 6.2 0 - 99.8 fL    Dg Chest 2 View  Result Date: 05/07/2018 CLINICAL DATA:  progressive productive cough, fatigue, smokerprogressive productive cough, fatigue, smoker EXAM: CHEST - 2 VIEW COMPARISON:  None. FINDINGS: Normal mediastinum and cardiac silhouette. Normal pulmonary vasculature. No evidence of effusion, infiltrate, or  pneumothorax. No acute bony abnormality. IMPRESSION: No acute cardiopulmonary process. Electronically Signed   By: Suzy Bouchard M.D.   On: 05/07/2018 09:20     ASSESSMENT and PLAN  1. Essential hypertension Controlled. Continue current regime.  - Care order/instruction:  2. Depression with anxiety Uncontrolled. Discussed treatment options. Decided to increase zoloft and augment with abilify, also [facility] and [facility]  3. Alcohol consumption of one to four drinks per day See #3  4. Cough AF, normal WBC and CXR, no SOB. Cont with supportive measures and OTC meds as needed. RTC precautions given. - POCT CBC - DG Chest 2 View; Future  Other orders - sertraline (ZOLOFT) 100 MG tablet; Take 1.5 tablets (150 mg total) by mouth daily. - ARIPiprazole (ABILIFY) 5 MG tablet; Take 1 tablet (5 mg total) by mouth daily.  Return in about 4 weeks (around 06/04/2018).    Rutherford Guys, MD Primary Care at [place], [place] [address] Ph.  [phone] Fax [phone]

## 2018-05-07 NOTE — Patient Instructions (Addendum)
[facility]  [address], [place], [place] [address]  Phone: [phone]  [facility] accepts most major insurances. [phone] or email [email or web address]                             If you have lab work done today you will be contacted with your lab results within the next 2 weeks.  If you have not heard from [place] then please contact us. The fastest way to get your results is to register for My Chart.   IF you received an x-ray today, you will receive an invoice from [facility]. Please contact [facility] at [phone] with questions or concerns regarding your invoice.   IF you received labwork today, you will receive an invoice from [facility]. Please contact [facility] at [phone] with questions or concerns regarding your invoice.   Our billing staff will not be able to assist you with questions regarding bills from these companies.  You will be contacted with the lab results as soon as they are available. The fastest way to get your results is to activate your My Chart account. Instructions are located on the last page of this paperwork. If you have not heard from [place] regarding the results in 2 weeks, please contact this office.

## 2018-06-04 ENCOUNTER — Ambulatory Visit: Payer: Commercial Managed Care - PPO | Admitting: Family Medicine

## 2018-07-06 ENCOUNTER — Other Ambulatory Visit: Payer: Self-pay | Admitting: Family Medicine

## 2018-07-06 DIAGNOSIS — I10 Essential (primary) hypertension: Secondary | ICD-10-CM

## 2018-07-13 ENCOUNTER — Other Ambulatory Visit: Payer: Self-pay | Admitting: Family Medicine

## 2018-07-13 NOTE — Telephone Encounter (Signed)
Requested Prescriptions  Pending Prescriptions Disp Refills  . sertraline (ZOLOFT) 100 MG tablet [Pharmacy Med Name: SERTRALINE HCL 100MG  TABLET] 135 tablet 0    Sig: TAKE 1 AND 1/2 TABLETS BY  MOUTH DAILY     Psychiatry:  Antidepressants - SSRI Passed - 07/13/2018  5:58 AM      Passed - Completed PHQ-2 or PHQ-9 in the last 360 days.      Passed - Valid encounter within last 6 months    Recent Outpatient Visits          2 months ago Essential hypertension   [facility], Lilia Argue, MD   5 months ago Annual physical exam   [facility], Lilia Argue, MD   6 months ago Essential hypertension   [facility], Lilia Argue, MD   1 year ago Annual physical exam   [facility] at [place], [place], [place]   1 year ago Essential hypertension   [facility] at [place], [place], [place]

## 2018-08-16 ENCOUNTER — Other Ambulatory Visit: Payer: Self-pay | Admitting: Family Medicine

## 2018-08-16 MED ORDER — SERTRALINE HCL 100 MG PO TABS: 150.0000 mg | ORAL_TABLET | Freq: Every day | ORAL | 0 refills | Status: DC

## 2018-08-16 NOTE — Telephone Encounter (Signed)
Copied from [place] [id number]. Topic: Quick Communication - Rx Refill/Question >> Aug 16, 2018 10:09 AM Gustavus Messing wrote: Medication: sertraline (ZOLOFT) 100 MG tablet   Has the patient contacted their pharmacy? No. (Agent: If no, request that the patient contact the pharmacy for the refill.) Patient uses [place], [facility] but they did not refill her Zoloft. She needs it now because she is completley out. [facility] will not refill it until Friday and she wanted a few pills until then sent to [facility]   Preferred Pharmacy (with phone number or street name): [facility] [place], [place] [phone] (Phone) [phone] (Fax)    Agent: Please be advised that RX refills may take up to 3 business days. We ask that you follow-up with your pharmacy.

## 2018-08-25 ENCOUNTER — Other Ambulatory Visit: Payer: Self-pay | Admitting: Family Medicine

## 2018-10-27 ENCOUNTER — Other Ambulatory Visit: Payer: Self-pay

## 2018-10-27 ENCOUNTER — Telehealth: Payer: Self-pay | Admitting: Family Medicine

## 2018-10-27 DIAGNOSIS — F418 Other specified anxiety disorders: Secondary | ICD-10-CM

## 2018-10-27 NOTE — Telephone Encounter (Signed)
Copied from [place] [id number]. Topic: Quick Communication - Rx Refill/Question >> Oct 27, 2018  3:42 PM Selinda Flavin B, NT wrote: Medication: sertraline (ZOLOFT) 100 MG tablet  Has the patient contacted their pharmacy? yes (Agent: If no, request that the patient contact the pharmacy for the refill.) (Agent: If yes, when and what did the pharmacy advise?)  Preferred Pharmacy (with phone number or street name): [facility]  Agent: Please be advised that RX refills may take up to 3 business days. We ask that you follow-up with your pharmacy.

## 2018-10-27 NOTE — Telephone Encounter (Signed)
Copied from [place] [id number]. Topic: Referral - Request for Referral >> Oct 27, 2018  3:44 PM Rutherford Nail, NT wrote: Has patient seen PCP for this complaint? no *If NO, is insurance requiring patient see PCP for this issue before PCP can refer them? Referral for which specialty: Behavioral health Preferred provider/office: no preference Reason for referral: States that her son was recently admitted to behavioral health for suicidal ideations. States that she has taken his situation hard. Would just like ot be able to speak to someone.

## 2018-10-28 ENCOUNTER — Other Ambulatory Visit: Payer: Self-pay

## 2018-10-28 ENCOUNTER — Telehealth (INDEPENDENT_AMBULATORY_CARE_PROVIDER_SITE_OTHER): Payer: Commercial Managed Care - PPO | Admitting: Family Medicine

## 2018-10-28 ENCOUNTER — Encounter: Payer: Self-pay | Admitting: Family Medicine

## 2018-10-28 DIAGNOSIS — Z62820 Parent-biological child conflict: Secondary | ICD-10-CM

## 2018-10-28 DIAGNOSIS — F331 Major depressive disorder, recurrent, moderate: Secondary | ICD-10-CM

## 2018-10-28 DIAGNOSIS — F430 Acute stress reaction: Secondary | ICD-10-CM | POA: Diagnosis not present

## 2018-10-28 NOTE — Progress Notes (Signed)
Virtual Visit Note  I connected with patient on 10/28/18 at 316pm by phone and verified that I am speaking with the correct person using two identifiers. Jodi Pruitt is currently located at home and patient is currently with them during visit. The provider, Rutherford Guys, MD is located in their office at time of visit.  I discussed the limitations, risks, security and privacy concerns of performing an evaluation and management service by telephone and the availability of in person appointments. I also discussed with the patient that there may be a patient responsible charge related to this service. The patient expressed understanding and agreed to proceed.   CC: depression  HPI ? Patient is a 63 y.o. female with past medical history significant for HCV, HTN, depresion and anxietywho presents today for routine followup  Last OV nov 2019 Increased sertraline to 150 mg, started low dose abilify Referred to psychiatry  Did not tolerate Abilify, too sedating Still taking sertraline 150 mg Does not believe she is doing any better Currently struggling with her son trying to commit suicide multiple times during 3 past week Her sister, who is a Marine scientist, has been very supportive Was hospitalized for stabilization but now back home Advised to go into inpatient rehab, that did not work, she took [employer] for 30 days as did not feel that he was safe to be alone Mom reports that he drinks etoh and smokes THC Mom reports herself as an Firefighter regarding his drinking Patient reports she is traumatized from this experience, requesting referral for counseling No alcohol in the house, she is not drinking either Denies SI    Telemedicine from 10/28/2018 in Primary Care at [facility]  PHQ-9 Total Score  13      Allergies  Allergen Reactions  . Compazine [Prochlorperazine Edisylate] Other (See Comments)    HALLUCINATION    Prior to Admission medications   Medication Sig Start Date End Date  Taking? Authorizing Provider  losartan (COZAAR) 50 MG tablet TAKE 1 TABLET BY MOUTH DAILY 02/12/18  Yes Rutherford Guys, MD  Multiple Vitamin (MULTIVITAMIN) tablet Take 1 tablet by mouth daily. Reported on 09/04/2015   Yes [provider]  Naproxen Sodium (ALEVE PO) Take by mouth as needed. Reported on 09/04/2015   Yes [provider]  sertraline (ZOLOFT) 100 MG tablet Take 1.5 tablets (150 mg total) by mouth daily. 08/16/18  Yes Rutherford Guys, MD  ARIPiprazole (ABILIFY) 5 MG tablet Take 1 tablet (5 mg total) by mouth daily. Patient not taking: Reported on 10/28/2018 05/07/18   Rutherford Guys, MD  losartan (COZAAR) 50 MG tablet TAKE 1 TABLET BY MOUTH  DAILY Patient not taking: Reported on 10/28/2018 07/07/18   Rutherford Guys, MD    Past Medical History:  Diagnosis Date  . Anxiety   . Arthritis   . Depression   . Hepatitis C, chronic ([place])   . HTN (hypertension)     Past Surgical History:  Procedure Laterality Date  . CESAREAN SECTION    . FRACTURE SURGERY    . NEPHRECTOMY      Social History   Tobacco Use  . Smoking status: Current Every Day Smoker    Packs/day: 1.50    Years: 40.00    Pack years: 60.00    Types: Cigarettes  . Smokeless tobacco: Never Used  Substance Use Topics  . Alcohol use: Yes    Comment: DRINK WINE    Family History  Problem Relation Age of Onset  .  Hypertension Mother   . Heart disease Mother   . Hyperlipidemia Mother   . Stroke Mother   . Parkinson's disease Father   . Heart disease Father   . Cancer Sister   . Cancer Maternal Grandmother     ROS Per hpi  Objective  Vitals as reported by the patient: none   ASSESSMENT and PLAN  1. Acute stress reaction 2. Parent-child relationship problem Has good support in her sister. Referring to counseling urgently given current trauma. I have asked patient to let us know if she has not heard from therapist by next week. - Ambulatory referral to Psychology - Ambulatory  referral to Psychiatry  3. Moderate episode of recurrent major depressive disorder ([place]) Continue with sertraline 150mg  daily. Referring to psychiatry for further eval and treatment - Ambulatory referral to Psychology - Ambulatory referral to Psychiatry  FOLLOW-UP: 4 weeks   The above assessment and management plan was discussed with the patient. The patient verbalized understanding of and has agreed to the management plan. Patient is aware to call the clinic if symptoms persist or worsen. Patient is aware when to return to the clinic for a follow-up visit. Patient educated on when it is appropriate to go to the emergency department.    I provided 29 minutes of non-face-to-face time during this encounter.  Rutherford Guys, MD Primary Care at [facility] [place], [place] [address] Ph.  [phone] Fax [phone]

## 2018-10-28 NOTE — Telephone Encounter (Signed)
Hi. I made referral. Marked urgent. Can we please followup on this and make sure she gets scheduled. Thanks

## 2018-10-28 NOTE — Telephone Encounter (Signed)
Has apt today 

## 2018-10-28 NOTE — Progress Notes (Signed)
Pt c/o referral for behavioral health having some trouble with some trying to commit suicide. Depression score was a 13.

## 2018-10-28 NOTE — Telephone Encounter (Signed)
Referral made 

## 2018-11-02 ENCOUNTER — Ambulatory Visit (INDEPENDENT_AMBULATORY_CARE_PROVIDER_SITE_OTHER): Payer: Commercial Managed Care - PPO | Admitting: Psychology

## 2018-11-02 DIAGNOSIS — F330 Major depressive disorder, recurrent, mild: Secondary | ICD-10-CM

## 2018-11-10 ENCOUNTER — Ambulatory Visit (INDEPENDENT_AMBULATORY_CARE_PROVIDER_SITE_OTHER): Payer: Commercial Managed Care - PPO | Admitting: Psychology

## 2018-11-10 DIAGNOSIS — F330 Major depressive disorder, recurrent, mild: Secondary | ICD-10-CM

## 2018-11-18 ENCOUNTER — Ambulatory Visit (INDEPENDENT_AMBULATORY_CARE_PROVIDER_SITE_OTHER): Payer: Commercial Managed Care - PPO | Admitting: Psychology

## 2018-11-18 DIAGNOSIS — F330 Major depressive disorder, recurrent, mild: Secondary | ICD-10-CM | POA: Diagnosis not present

## 2018-11-18 NOTE — Telephone Encounter (Signed)
I see upcoming appt with behavioral health

## 2018-11-22 ENCOUNTER — Telehealth: Payer: Self-pay | Admitting: Family Medicine

## 2018-11-22 NOTE — Telephone Encounter (Signed)
Copied from [place] [id number]. Topic: Quick Communication - Rx Refill/Question >> Nov 22, 2018  3:31 PM Sheran Luz wrote: Medication: sertraline (ZOLOFT) 100 MG tablet   Patient is requesting a refill of this medication, however pharmacy is unable to send request as their system lists Dr. Pamella Pert as a physician in [place].   Has the patient contacted their pharmacy? Yes, patient advised to contact office.   Preferred Pharmacy (with phone number or street name):[place], [place] [employer] [phone] (Phone) [phone] (Fax)

## 2018-11-23 ENCOUNTER — Other Ambulatory Visit: Payer: Self-pay

## 2018-11-23 MED ORDER — SERTRALINE HCL 100 MG PO TABS: 150.0000 mg | ORAL_TABLET | Freq: Every day | ORAL | 0 refills | Status: DC

## 2018-11-23 NOTE — Telephone Encounter (Signed)
Sent 90 day supply to pharmacy  

## 2018-11-25 ENCOUNTER — Ambulatory Visit: Payer: Commercial Managed Care - PPO | Admitting: Family Medicine

## 2018-11-26 ENCOUNTER — Ambulatory Visit (INDEPENDENT_AMBULATORY_CARE_PROVIDER_SITE_OTHER): Payer: Commercial Managed Care - PPO | Admitting: Psychology

## 2018-11-26 DIAGNOSIS — F330 Major depressive disorder, recurrent, mild: Secondary | ICD-10-CM

## 2018-12-01 ENCOUNTER — Ambulatory Visit (INDEPENDENT_AMBULATORY_CARE_PROVIDER_SITE_OTHER): Payer: Commercial Managed Care - PPO | Admitting: Psychology

## 2018-12-01 DIAGNOSIS — F330 Major depressive disorder, recurrent, mild: Secondary | ICD-10-CM

## 2018-12-16 ENCOUNTER — Ambulatory Visit (INDEPENDENT_AMBULATORY_CARE_PROVIDER_SITE_OTHER): Payer: Commercial Managed Care - PPO | Admitting: Psychology

## 2018-12-16 DIAGNOSIS — F330 Major depressive disorder, recurrent, mild: Secondary | ICD-10-CM

## 2018-12-27 ENCOUNTER — Ambulatory Visit: Payer: Commercial Managed Care - PPO

## 2018-12-27 ENCOUNTER — Ambulatory Visit: Payer: Commercial Managed Care - PPO | Admitting: Family Medicine

## 2018-12-27 ENCOUNTER — Other Ambulatory Visit: Payer: Self-pay

## 2018-12-27 ENCOUNTER — Encounter: Payer: Self-pay | Admitting: Family Medicine

## 2018-12-27 VITALS — BP 140/80 | HR 90 | Temp 98.5°F | Ht 63.0 in | Wt 136.0 lb

## 2018-12-27 DIAGNOSIS — Z1211 Encounter for screening for malignant neoplasm of colon: Secondary | ICD-10-CM | POA: Diagnosis not present

## 2018-12-27 DIAGNOSIS — R634 Abnormal weight loss: Secondary | ICD-10-CM | POA: Diagnosis not present

## 2018-12-27 DIAGNOSIS — B182 Chronic viral hepatitis C: Secondary | ICD-10-CM

## 2018-12-27 DIAGNOSIS — Z1231 Encounter for screening mammogram for malignant neoplasm of breast: Secondary | ICD-10-CM

## 2018-12-27 DIAGNOSIS — I10 Essential (primary) hypertension: Secondary | ICD-10-CM

## 2018-12-27 DIAGNOSIS — Z789 Other specified health status: Secondary | ICD-10-CM

## 2018-12-27 DIAGNOSIS — F331 Major depressive disorder, recurrent, moderate: Secondary | ICD-10-CM

## 2018-12-27 MED ORDER — ACAMPROSATE CALCIUM 333 MG PO TBEC: 333.0000 mg | DELAYED_RELEASE_TABLET | Freq: Three times a day (TID) | ORAL | 2 refills | Status: DC

## 2018-12-27 NOTE — Progress Notes (Signed)
7/6/202012:09 PM  Jodi Pruitt 1956/03/29, 63 y.o., female [id number]  Chief Complaint  Patient presents with  . Weight Loss    may be due to stress due to son trying to commit suicide, she is also a smoker for 19yrwants to be screened for cancer  . Medication Refill    cozzar, naproxen and zoloft    HPI:   Patient is a 63y.o. 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 She has never been treated for hepatitis C  Denies any abd pain, nausea, vomiting, abd distention, feels bowel frequency without completion of bowel movement Denies any chest pain, SOB, fever or chills, nightsweats Continues to have cough, productive, no hemoptysis Continues to smoke, 1ppd, since age [id number] any swollen glands  Last pap 3 years ago - normal Last mammogram about 3 years ago - normal Reports colonoscopy about 15 years ago, had small polyps, does not remember recall   Wt Readings from Last 3 Encounters:  12/27/18 136 lb (61.7 kg)  05/07/18 152 lb (68.9 kg)  02/02/18 155 lb (70.3 kg)   BP Readings from Last 3 Encounters:  12/27/18 140/80  05/07/18 138/78  02/02/18 132/74    Depression screen PHQ 2/9 12/27/2018 10/28/2018 05/07/2018  Decreased Interest '2 3 3  '$ Down, Depressed, Hopeless '2 3 3  '$ PHQ - 2 Score '4 6 6  '$ Altered sleeping '3 1 3  '$ Tired, decreased energy '2 3 2  '$ Change in appetite 3 0 3  Feeling bad or failure about yourself  '2 3 3  '$ Trouble concentrating 0 0 2  Moving slowly or fidgety/restless 0 0 2  Suicidal thoughts 0 0 2  PHQ-9 Score '14 13 23  '$ Difficult doing work/chores Somewhat difficult - Very difficult   GAD  7 : Generalized Anxiety Score 12/27/2018 05/07/2018  Nervous, Anxious, on Edge 1 3  Control/stop worrying 2 2  Worry too much - different things 2 2  Trouble relaxing 2 2  Restless 1 2  Easily annoyed or irritable 1 2  Afraid - awful might happen 1 2  Total GAD 7 Score 10 15  Anxiety Difficulty Somewhat difficult Very difficult     Fall Risk  12/27/2018 10/28/2018 02/02/2018 12/16/2017 01/26/2017  Falls in the past year? 0 0 No No No  Number falls in past yr: 0 - - - -  Injury with Fall? 0 - - - -     Allergies  Allergen Reactions  . Compazine [Prochlorperazine Edisylate] Other (See Comments)    HALLUCINATION    Prior to Admission medications   Medication Sig Start Date End Date Taking? Authorizing Provider  losartan (COZAAR) 50 MG tablet TAKE 1 TABLET BY MOUTH DAILY 02/12/18  Yes SRutherford Guys MD  Multiple Vitamin (MULTIVITAMIN) tablet Take 1 tablet by mouth daily. Reported on 09/04/2015   Yes [provider]  Naproxen Sodium (ALEVE PO) Take by mouth as needed. Reported on 09/04/2015   Yes [provider]  sertraline (ZOLOFT) 100 MG tablet Take 1.5 tablets (150 mg total) by mouth daily. 11/23/18  Yes SRutherford Guys MD    Past Medical History:  Diagnosis  Date  . Anxiety   . Arthritis   . Depression   . Hepatitis C, chronic ([place])   . HTN (hypertension)     Past Surgical History:  Procedure Laterality Date  . CESAREAN SECTION    . FRACTURE SURGERY    . NEPHRECTOMY      Social History   Tobacco Use  . Smoking status: Current Every Day Smoker    Packs/day: 1.50    Years: 40.00    Pack years: 60.00    Types: Cigarettes  . Smokeless tobacco: Never Used  Substance Use Topics  . Alcohol use: Yes    Comment: DRINK WINE    Family History  Problem Relation Age of Onset  . Hypertension Mother   . Heart disease Mother   . Hyperlipidemia Mother   . Stroke Mother   . Parkinson's disease Father   . Heart disease Father   . Cancer Sister   . Cancer  Maternal Grandmother     ROS Per hpi  OBJECTIVE:  Today's Vitals   12/27/18 1147  BP: 140/80  Pulse: 90  Temp: 98.5 F (36.9 C)  TempSrc: Oral  SpO2: 95%  Weight: 136 lb (61.7 kg)  Height: '5\' 3"'$  (1.6 m)   Body mass index is 24.09 kg/m.   Physical Exam Vitals signs and nursing note reviewed.  Constitutional:      Appearance: She is well-developed.  HENT:     Head: Normocephalic and atraumatic.     Right Ear: Hearing, tympanic membrane, ear canal and external ear normal.     Left Ear: Hearing, tympanic membrane, ear canal and external ear normal.     Mouth/Throat:     Mouth: Mucous membranes are moist.     Pharynx: No oropharyngeal exudate or posterior oropharyngeal erythema.  Eyes:     Extraocular Movements: Extraocular movements intact.     Conjunctiva/sclera: Conjunctivae normal.     Pupils: Pupils are equal, round, and reactive to light.  Neck:     Musculoskeletal: Neck supple.     Thyroid: No thyromegaly.  Cardiovascular:     Rate and Rhythm: Normal rate and regular rhythm.     Heart sounds: Normal heart sounds. No murmur. No friction rub. No gallop.   Pulmonary:     Effort: Pulmonary effort is normal.     Breath sounds: Normal breath sounds. No wheezing, rhonchi or rales.  Abdominal:     General: Bowel sounds are normal. There is no distension.     Palpations: Abdomen is soft. There is no hepatomegaly, splenomegaly or mass.     Tenderness: There is no abdominal tenderness.  Musculoskeletal: Normal range of motion.     Right lower leg: No edema.     Left lower leg: No edema.  Lymphadenopathy:     Cervical: No cervical adenopathy.  Skin:    General: Skin is warm and dry.  Neurological:     Mental Status: She is alert and oriented to person, place, and time.     Cranial Nerves: No cranial nerve deficit.     Gait: Gait normal.     Deep Tendon Reflexes: Reflexes are normal and symmetric.  Psychiatric:        Mood and Affect: Mood normal.         Behavior: Behavior normal.     ASSESSMENT and PLAN  1. Loss of weight Most likely multifactorial. Labs and age appropriate cancer screening. Discussed again role of etoh on mood. Starting acamprosate. Reviewed r/se/b. Continue  with counseling.  - TSH - CMP14+EGFR - CBC - Ambulatory referral to Gastroenterology - DG Chest 2 View; Future  2. Chronic hepatitis C without hepatic coma (HCC) - HCV Ab w/Rflx to Verification - Ambulatory referral to Gastroenterology - AFP tumor marker - Protime-INR - US Abdomen Limited RUQ; Future  3. Screen for colon cancer - Ambulatory referral to Gastroenterology  4. Visit for screening mammogram - MM DIGITAL SCREENING BILATERAL; Future  5. Essential hypertension Controlled. Continue current regime.  - Lipid panel  6. Moderate episode of recurrent major depressive disorder ([place])  7. Alcohol consumption of one to four drinks per day  Other orders - acamprosate (CAMPRAL) 333 MG tablet; Take 1 tablet (333 mg total) by mouth 3 (three) times daily with meals.  Return in about 4 weeks (around 01/24/2019) for pap.    Rutherford Guys, MD Primary Care at [place], [place] [address] Ph.  [phone] Fax [phone]

## 2018-12-28 LAB — CMP14+EGFR
ALT: 21 IU/L (ref 0–32)
AST: 33 IU/L (ref 0–40)
Albumin/Globulin Ratio: 1.4 (ref 1.2–2.2)
Albumin: 4.3 g/dL (ref 3.8–4.8)
Alkaline Phosphatase: 65 IU/L (ref 39–117)
BUN/Creatinine Ratio: 18 (ref 12–28)
BUN: 16 mg/dL (ref 8–27)
Bilirubin Total: 0.4 mg/dL (ref 0.0–1.2)
CO2: 23 mmol/L (ref 20–29)
Calcium: 9.6 mg/dL (ref 8.7–10.3)
Chloride: 100 mmol/L (ref 96–106)
Creatinine, Ser: 0.89 mg/dL (ref 0.57–1.00)
GFR calc Af Amer: 79 mL/min/{1.73_m2} (ref >59)
GFR calc non Af Amer: 68 mL/min/{1.73_m2} (ref >59)
Globulin, Total: 3 g/dL (ref 1.5–4.5)
Glucose: 97 mg/dL (ref 65–99)
Potassium: 4.6 mmol/L (ref 3.5–5.2)
Sodium: 140 mmol/L (ref 134–144)
Total Protein: 7.3 g/dL (ref 6.0–8.5)

## 2018-12-28 LAB — CBC
Hematocrit: 45.5 % (ref 34.0–46.6)
Hemoglobin: 15.2 g/dL (ref 11.1–15.9)
MCH: 31.9 pg (ref 26.6–33.0)
MCHC: 33.4 g/dL (ref 31.5–35.7)
MCV: 96 fL (ref 79–97)
Platelets: 314 10*3/uL (ref 150–450)
RBC: 4.76 x10E6/uL (ref 3.77–5.28)
RDW: 13.7 % (ref 11.7–15.4)
WBC: 10.4 10*3/uL (ref 3.4–10.8)

## 2018-12-28 LAB — TSH: TSH: 3.55 u[IU]/mL (ref 0.450–4.500)

## 2018-12-28 LAB — LIPID PANEL
Chol/HDL Ratio: 4.2 ratio (ref 0.0–4.4)
Cholesterol, Total: 203 mg/dL — ABNORMAL HIGH (ref 100–199)
HDL: 48 mg/dL (ref >39)
LDL Calculated: 129 mg/dL — ABNORMAL HIGH (ref 0–99)
Triglycerides: 130 mg/dL (ref 0–149)
VLDL Cholesterol Cal: 26 mg/dL (ref 5–40)

## 2018-12-28 LAB — PROTIME-INR
INR: 1 (ref 0.8–1.2)
Prothrombin Time: 10.3 s (ref 9.1–12.0)

## 2018-12-28 LAB — AFP TUMOR MARKER: AFP, Serum, Tumor Marker: 5.7 ng/mL (ref 0.0–8.3)

## 2019-01-03 ENCOUNTER — Ambulatory Visit (INDEPENDENT_AMBULATORY_CARE_PROVIDER_SITE_OTHER): Payer: Commercial Managed Care - PPO | Admitting: Psychology

## 2019-01-03 DIAGNOSIS — F330 Major depressive disorder, recurrent, mild: Secondary | ICD-10-CM | POA: Diagnosis not present

## 2019-01-05 LAB — HCV RNA NAA QUALITATIVE

## 2019-01-05 LAB — HCV AB W/RFLX TO VERIFICATION: HCV Ab: >11 s/co ratio — ABNORMAL HIGH (ref 0.0–0.9)

## 2019-01-18 ENCOUNTER — Ambulatory Visit (INDEPENDENT_AMBULATORY_CARE_PROVIDER_SITE_OTHER): Payer: Commercial Managed Care - PPO | Admitting: Psychology

## 2019-01-18 DIAGNOSIS — F330 Major depressive disorder, recurrent, mild: Secondary | ICD-10-CM | POA: Diagnosis not present

## 2019-01-28 ENCOUNTER — Other Ambulatory Visit: Payer: Self-pay | Admitting: Family Medicine

## 2019-01-28 DIAGNOSIS — I10 Essential (primary) hypertension: Secondary | ICD-10-CM

## 2019-01-28 NOTE — Telephone Encounter (Signed)
Per pharmacy note pt wants a year supply..please advise

## 2019-01-31 ENCOUNTER — Encounter: Payer: Self-pay | Admitting: Family Medicine

## 2019-02-01 NOTE — Telephone Encounter (Signed)
Requesting refill of medication to be filled by MD only and for bp meds to be filled for 1 yr

## 2019-02-04 ENCOUNTER — Ambulatory Visit (INDEPENDENT_AMBULATORY_CARE_PROVIDER_SITE_OTHER): Payer: Commercial Managed Care - PPO | Admitting: Psychology

## 2019-02-04 DIAGNOSIS — F330 Major depressive disorder, recurrent, mild: Secondary | ICD-10-CM | POA: Diagnosis not present

## 2019-02-18 ENCOUNTER — Ambulatory Visit (INDEPENDENT_AMBULATORY_CARE_PROVIDER_SITE_OTHER): Payer: Commercial Managed Care - PPO | Admitting: Psychology

## 2019-02-18 DIAGNOSIS — F330 Major depressive disorder, recurrent, mild: Secondary | ICD-10-CM

## 2019-02-28 ENCOUNTER — Other Ambulatory Visit: Payer: Self-pay | Admitting: Family Medicine

## 2019-03-01 NOTE — Telephone Encounter (Signed)
Requested medication (s) are due for refill today: yes  Requested medication (s) are on the active medication list: yes  Last refill:  01/28/2019  Future visit scheduled: no  Notes to clinic:  Requesting 1 year supply   Requested Prescriptions  Pending Prescriptions Disp Refills   sertraline (ZOLOFT) 100 MG tablet [Pharmacy Med Name: SERTRALINE HCL 100MG  TABLET] 90 tablet 5    Sig: TAKE 1 AND 1/2 TABLETS BY  MOUTH DAILY     Psychiatry:  Antidepressants - SSRI Passed - 02/28/2019  7:00 AM      Passed - Valid encounter within last 6 months    Recent Outpatient Visits          2 months ago Loss of weight   [facility], Lilia Argue, MD   4 months ago Acute stress reaction   [facility], Lilia Argue, MD   9 months ago Essential hypertension   [facility], Lilia Argue, MD   1 year ago Annual physical exam   [facility], Lilia Argue, MD   1 year ago Essential hypertension   [facility], Lilia Argue, MD             Passed - Completed PHQ-2 or PHQ-9 in the last 360 days.

## 2019-03-03 NOTE — Telephone Encounter (Signed)
Patient is requesting a refill of the following medications: Requested Prescriptions   Pending Prescriptions Disp Refills  . sertraline (ZOLOFT) 100 MG tablet [Pharmacy Med Name: SERTRALINE HCL 100MG  TABLET] 90 tablet 5    Sig: TAKE 1 AND 1/2 TABLETS BY  MOUTH DAILY    Date of patient request:03/01/2019 Last office visit: 12/27/2018 Date of last refill: 01/28/2019 Last refill amount: 90 Follow up time period per chart:

## 2019-03-04 ENCOUNTER — Ambulatory Visit (INDEPENDENT_AMBULATORY_CARE_PROVIDER_SITE_OTHER): Payer: Commercial Managed Care - PPO | Admitting: Psychology

## 2019-03-04 DIAGNOSIS — F330 Major depressive disorder, recurrent, mild: Secondary | ICD-10-CM | POA: Diagnosis not present

## 2019-03-18 ENCOUNTER — Ambulatory Visit: Payer: Commercial Managed Care - PPO | Admitting: Psychology

## 2019-07-09 ENCOUNTER — Other Ambulatory Visit: Payer: Self-pay | Admitting: Family Medicine

## 2019-07-09 DIAGNOSIS — I10 Essential (primary) hypertension: Secondary | ICD-10-CM

## 2019-07-29 ENCOUNTER — Other Ambulatory Visit: Payer: Self-pay | Admitting: Family Medicine

## 2019-09-10 ENCOUNTER — Other Ambulatory Visit: Payer: Self-pay | Admitting: Family Medicine

## 2019-09-10 NOTE — Telephone Encounter (Signed)
Requested medications are due for refill today? Yes  Requested medications are on active medication list? Yes  Last Refill: 07/30/2019   # 45 with no refills  - Courtesy refill  Future visit scheduled?  No  Notes to Clinic:  Patient was supposed to f/u in August of last year.  Patient last seen 8 months ago.  Patient needs office visit.

## 2019-09-28 ENCOUNTER — Other Ambulatory Visit: Payer: Self-pay | Admitting: Family Medicine

## 2019-09-28 DIAGNOSIS — I10 Essential (primary) hypertension: Secondary | ICD-10-CM

## 2019-09-28 NOTE — Telephone Encounter (Signed)
Requested  medications are  due for refill today yes  Requested medications are on the active medication list yes  Last refill 2/15 (mail order)  Future visit scheduled no  Notes to clinic - failed protocol due to visit and labs out of protocol date.

## 2019-10-07 ENCOUNTER — Other Ambulatory Visit: Payer: Self-pay | Admitting: Family Medicine

## 2019-10-07 NOTE — Telephone Encounter (Signed)
Requested medications are on the active medication list yes  Last refill 4/3  Future visit scheduled no  Notes to clinic failed protocol due to no visit within 6 months

## 2019-10-10 NOTE — Telephone Encounter (Signed)
Attempted to call pt with no response.   Please schedule pt for f/u appointment for med refill Zoloft. Her curtsy refill given so she must have ov for additional refill consideration.

## 2019-11-25 ENCOUNTER — Telehealth: Payer: Self-pay | Admitting: Family Medicine

## 2019-11-25 NOTE — Telephone Encounter (Signed)
Attempted to call pt and there was no answer. Left message to call back.   Plan is to ask her what else has been going on and hasn't she been on this medication for years? Requesting more information about what is going on.

## 2019-11-25 NOTE — Telephone Encounter (Signed)
Pt called and stated she is having itchyess/sewlling/rashes on skin/eye lids ar puffy has an appt set for 11/29/19. Pt thinks it might be from her  losartan (COZAAR) 50 MG tablet [[id number]]   Medication. Pt would like a call from a nurse to see if she should still take the medication tonight.   [phone] Please advise

## 2019-11-29 ENCOUNTER — Other Ambulatory Visit: Payer: Self-pay

## 2019-11-29 ENCOUNTER — Encounter: Payer: Self-pay | Admitting: Family Medicine

## 2019-11-29 ENCOUNTER — Ambulatory Visit: Payer: Commercial Managed Care - PPO | Admitting: Family Medicine

## 2019-11-29 VITALS — BP 169/91 | HR 86 | Temp 97.5°F | Ht 63.0 in | Wt 144.0 lb

## 2019-11-29 DIAGNOSIS — B182 Chronic viral hepatitis C: Secondary | ICD-10-CM | POA: Diagnosis not present

## 2019-11-29 DIAGNOSIS — R21 Rash and other nonspecific skin eruption: Secondary | ICD-10-CM

## 2019-11-29 DIAGNOSIS — F3341 Major depressive disorder, recurrent, in partial remission: Secondary | ICD-10-CM | POA: Diagnosis not present

## 2019-11-29 DIAGNOSIS — I10 Essential (primary) hypertension: Secondary | ICD-10-CM

## 2019-11-29 MED ORDER — AMLODIPINE BESYLATE 10 MG PO TABS: 10.0000 mg | ORAL_TABLET | Freq: Every day | ORAL | 3 refills | Status: DC

## 2019-11-29 NOTE — Progress Notes (Signed)
6/8/20213:36 PM  Jodi Pruitt May 05, 1956, 64 y.o., female [id number]  Chief Complaint  Patient presents with  . Rash    eyes, patch above L eye bow , nose / not taken BP med 5 days - losartan   . Cough    smoker    HPI:   Patient is a 64 y.o. female with past medical history significant for HCV, HTN, depresion and anxiety who presents today for rash and cough  Had rash around eyelids, nose, chins, swelling, itchy Has been taking antihistamine Significantly better today No new exposures (soap, lotion, makeup, etc) Thinking it might be related to her losartan, as it has been making her itchy since the beginning) so she stopped taking it Cough with lisinopril Continues to smoke Has cut back on her drinking significantly She has not been treated for hepatitis C Feels depression is doing well, mood has been stable  Depression screen [facility] 2/9 12/27/2018 10/28/2018 05/07/2018  Decreased Interest 2 3 3   Down, Depressed, Hopeless 2 3 3   PHQ - 2 Score 4 6 6   Altered sleeping 3 1 3   Tired, decreased energy 2 3 2   Change in appetite 3 0 3  Feeling bad or failure about yourself  2 3 3   Trouble concentrating 0 0 2  Moving slowly or fidgety/restless 0 0 2  Suicidal thoughts 0 0 2  PHQ-9 Score 14 13 23   Difficult doing work/chores Somewhat difficult - Very difficult    Fall Risk  12/27/2018 10/28/2018 02/02/2018 12/16/2017 01/26/2017  Falls in the past year? 0 0 No No No  Number falls in past yr: 0 - - - -  Injury with Fall? 0 - - - -     Allergies  Allergen Reactions  . Compazine [Prochlorperazine Edisylate] Other (See Comments)    HALLUCINATION    Prior to Admission medications   Medication Sig Start Date End Date Taking? Authorizing Provider  Multiple Vitamin (MULTIVITAMIN) tablet Take 1 tablet by mouth daily. Reported on 09/04/2015   Yes [provider]  Naproxen Sodium (ALEVE PO) Take by mouth as needed. Reported on 09/04/2015   Yes [provider]    sertraline (ZOLOFT) 100 MG tablet TAKE 1 AND 1/2 TABLETS BY  MOUTH DAILY 09/12/19  Yes Rutherford Guys, MD  acamprosate (CAMPRAL) 333 MG tablet Take 1 tablet (333 mg total) by mouth 3 (three) times daily with meals. Patient not taking: Reported on 11/29/2019 12/27/18   Rutherford Guys, MD  losartan (COZAAR) 50 MG tablet TAKE 1 TABLET BY MOUTH  DAILY Patient not taking: Reported on 11/29/2019 09/29/19   Rutherford Guys, MD    Past Medical History:  Diagnosis Date  . Anxiety   . Arthritis   . Depression   . Hepatitis C, chronic ([place])   . HTN (hypertension)     Past Surgical History:  Procedure Laterality Date  . CESAREAN SECTION    . FRACTURE SURGERY    . NEPHRECTOMY      Social History   Tobacco Use  . Smoking status: Current Every Day Smoker    Packs/day: 1.50    Years: 40.00    Pack years: 60.00    Types: Cigarettes  . Smokeless tobacco: Never Used  Substance Use Topics  . Alcohol use: Yes    Comment: DRINK WINE    Family History  Problem Relation Age of Onset  . Hypertension Mother   . Heart disease Mother   . Hyperlipidemia Mother   .  Stroke Mother   . Parkinson's disease Father   . Heart disease Father   . Cancer Sister   . Cancer Maternal Grandmother     Review of Systems  Constitutional: Negative for chills and fever.  Respiratory: Positive for cough. Negative for shortness of breath.   Cardiovascular: Negative for chest pain, palpitations and leg swelling.  Gastrointestinal: Negative for abdominal pain, nausea and vomiting.     OBJECTIVE:  Today's Vitals   11/29/19 1533  BP: (!) 169/91  Pulse: 86  Temp: (!) 97.5 F (36.4 C)  SpO2: 98%  Weight: 144 lb (65.3 kg)  Height: 5\' 3"  (1.6 m)   Body mass index is 25.51 kg/m.   Physical Exam Vitals and nursing note reviewed.  Constitutional:      Appearance: She is well-developed.  HENT:     Head: Normocephalic and atraumatic.     Mouth/Throat:     Pharynx: No oropharyngeal exudate.  Eyes:      General: No scleral icterus.    Conjunctiva/sclera: Conjunctivae normal.     Pupils: Pupils are equal, round, and reactive to light.  Cardiovascular:     Rate and Rhythm: Normal rate and regular rhythm.     Heart sounds: Normal heart sounds. No murmur. No friction rub. No gallop.   Pulmonary:     Effort: Pulmonary effort is normal.     Breath sounds: Normal breath sounds. No wheezing or rales.  Musculoskeletal:     Cervical back: Neck supple.  Skin:    General: Skin is warm and dry.     Findings: Rash (erythematous scaly macules on eyelids) present.  Neurological:     Mental Status: She is alert and oriented to person, place, and time.     No results found for this or any previous visit (from the past 24 hour(s)).  No results found.   ASSESSMENT and PLAN  1. Essential hypertension Not controlled as off meds. Concern for side effects. Start amlodipine, reviewed r/se/b. - Comprehensive metabolic panel - Lipid panel  2. Rash and nonspecific skin eruption Sign improved with oral antihistamine. Cont to monitor  3. Chronic hepatitis C without hepatic coma ([place]) Patient declines treatment at this time or [place]. Asymptomatic. Labs pending. - Hepatitis c antibody (reflex) - AFP tumor marker - CBC  4. Recurrent major depressive disorder, in partial remission (H[facility]) Stable. Cont current regime  Other orders - amLODipine (NORVASC) 10 MG tablet; Take 1 tablet (10 mg total) by mouth daily.  Return for CPE.    Rutherford Guys, MD [facility] [place], [place] [address] Ph.  [phone] Fax [phone]

## 2019-11-29 NOTE — Patient Instructions (Signed)
° ° ° °  If you have lab work done today you will be contacted with your lab results within the next 2 weeks.  If you have not heard from us then please contact us. The fastest way to get your results is to register for My Chart. ° ° °IF you received an x-ray today, you will receive an invoice from [facility]. Please contact [facility] at [phone] with questions or concerns regarding your invoice.  ° °IF you received labwork today, you will receive an invoice from [facility]. Please contact [facility] at [phone] with questions or concerns regarding your invoice.  ° °Our billing staff will not be able to assist you with questions regarding bills from these companies. ° °You will be contacted with the lab results as soon as they are available. The fastest way to get your results is to activate your My Chart account. Instructions are located on the last page of this paperwork. If you have not heard from us regarding the results in 2 weeks, please contact this office. °  ° ° ° °

## 2019-11-30 LAB — CMP14+EGFR
ALT: 31 IU/L (ref 0–32)
AST: 49 IU/L — ABNORMAL HIGH (ref 0–40)
Albumin/Globulin Ratio: 1.2 (ref 1.2–2.2)
Albumin: 4.3 g/dL (ref 3.8–4.8)
Alkaline Phosphatase: 84 IU/L (ref 48–121)
BUN/Creatinine Ratio: 14 (ref 12–28)
BUN: 11 mg/dL (ref 8–27)
Bilirubin Total: 0.5 mg/dL (ref 0.0–1.2)
CO2: 22 mmol/L (ref 20–29)
Calcium: 9.6 mg/dL (ref 8.7–10.3)
Chloride: 103 mmol/L (ref 96–106)
Creatinine, Ser: 0.77 mg/dL (ref 0.57–1.00)
GFR calc Af Amer: 94 mL/min/{1.73_m2} (ref >59)
GFR calc non Af Amer: 81 mL/min/{1.73_m2} (ref >59)
Globulin, Total: 3.6 g/dL (ref 1.5–4.5)
Glucose: 92 mg/dL (ref 65–99)
Potassium: 4.9 mmol/L (ref 3.5–5.2)
Sodium: 145 mmol/L — ABNORMAL HIGH (ref 134–144)
Total Protein: 7.9 g/dL (ref 6.0–8.5)

## 2019-11-30 LAB — COMMENT2 - HEP PANEL

## 2019-11-30 LAB — LIPID PANEL
Chol/HDL Ratio: 4.2 ratio (ref 0.0–4.4)
Cholesterol, Total: 209 mg/dL — ABNORMAL HIGH (ref 100–199)
HDL: 50 mg/dL (ref >39)
LDL Chol Calc (NIH): 139 mg/dL — ABNORMAL HIGH (ref 0–99)
Triglycerides: 114 mg/dL (ref 0–149)
VLDL Cholesterol Cal: 20 mg/dL (ref 5–40)

## 2019-11-30 LAB — CBC
Hematocrit: 49.8 % — ABNORMAL HIGH (ref 34.0–46.6)
Hemoglobin: 16.5 g/dL — ABNORMAL HIGH (ref 11.1–15.9)
MCH: 31.9 pg (ref 26.6–33.0)
MCHC: 33.1 g/dL (ref 31.5–35.7)
MCV: 96 fL (ref 79–97)
Platelets: 253 10*3/uL (ref 150–450)
RBC: 5.18 x10E6/uL (ref 3.77–5.28)
RDW: 14.1 % (ref 11.7–15.4)
WBC: 8.9 10*3/uL (ref 3.4–10.8)

## 2019-11-30 LAB — HEMOGLOBIN A1C
Est. average glucose Bld gHb Est-mCnc: 114 mg/dL
Hgb A1c MFr Bld: 5.6 % (ref 4.8–5.6)

## 2019-11-30 LAB — AFP TUMOR MARKER: AFP, Serum, Tumor Marker: 8 ng/mL (ref 0.0–8.3)

## 2019-11-30 LAB — HEPATITIS C ANTIBODY (REFLEX): HCV Ab: >11 s/co ratio — ABNORMAL HIGH (ref 0.0–0.9)

## 2019-12-06 NOTE — Progress Notes (Signed)
Called patient's mobile number to give lab results and automated message needs code to enter voice mail. Unable to leave message.

## 2019-12-08 ENCOUNTER — Telehealth: Payer: Self-pay | Admitting: Family Medicine

## 2019-12-08 DIAGNOSIS — B182 Chronic viral hepatitis C: Secondary | ICD-10-CM

## 2019-12-08 NOTE — Telephone Encounter (Signed)
Pt is returning a call from our office. Please advise.

## 2019-12-08 NOTE — Telephone Encounter (Signed)
-----   Message from Rutherford Guys, MD sent at 12/07/2019  9:46 PM EDT ----- Please let patient know that indeed she still has an active hepatitis c infection. Would she like to see a specialist to discuss possible treatment options, as they are much better tolerated now a days.  thanks

## 2019-12-08 NOTE — Telephone Encounter (Signed)
Left a msg for patient to return call. When patient return call please give patient below msg in reference to her labs

## 2019-12-08 NOTE — Telephone Encounter (Signed)
Patient was given lab results and stated she is willing to go be seen by a specialist for her active Hep C

## 2019-12-08 NOTE — Telephone Encounter (Signed)
Pt Returning a call Please Advice

## 2019-12-28 LAB — HCV RNA QUANT
HCV log10: 6.694 log10 IU/mL
Hepatitis C Quantitation: 4940000 IU/mL

## 2019-12-28 LAB — SPECIMEN STATUS REPORT

## 2020-02-15 ENCOUNTER — Other Ambulatory Visit: Payer: Self-pay

## 2020-02-15 ENCOUNTER — Encounter: Payer: Self-pay | Admitting: Infectious Diseases

## 2020-02-15 ENCOUNTER — Ambulatory Visit (INDEPENDENT_AMBULATORY_CARE_PROVIDER_SITE_OTHER): Payer: Commercial Managed Care - PPO | Admitting: Infectious Diseases

## 2020-02-15 ENCOUNTER — Telehealth: Payer: Self-pay | Admitting: Pharmacy Technician

## 2020-02-15 VITALS — BP 157/92 | HR 90 | Temp 98.3°F | Wt 145.0 lb

## 2020-02-15 DIAGNOSIS — Z789 Other specified health status: Secondary | ICD-10-CM

## 2020-02-15 DIAGNOSIS — F329 Major depressive disorder, single episode, unspecified: Secondary | ICD-10-CM | POA: Diagnosis not present

## 2020-02-15 DIAGNOSIS — B182 Chronic viral hepatitis C: Secondary | ICD-10-CM

## 2020-02-15 DIAGNOSIS — F32A Depression, unspecified: Secondary | ICD-10-CM

## 2020-02-15 NOTE — Assessment & Plan Note (Signed)
Chronic with relapse recently given stress at home and work. We discussed referral to new psychologist / counselor. Will refer to [facility] for care and see if she can meet with Jodi Pruitt.

## 2020-02-15 NOTE — Assessment & Plan Note (Signed)
Patient counseled on importance of avoidance of alcohol. She is not able to quit physically at this time so we created a plan to decrease to 2 shots of liquor a night, she will also commit to measuring out liquor.  She is interested in quitting - hopefully if depression and stress management can be addressed this will help support her to quit.

## 2020-02-15 NOTE — Progress Notes (Signed)
Patient Name: Jodi Pruitt  Date of Birth: August 10, 1955  MRN: [id number]  PCP: Rutherford Guys, MD  Referring Provider: Rutherford Guys, MD, Ph#: [phone]   Patient Active Problem List   Diagnosis Date Noted  . Alcohol consumption heavy 02/15/2020  . Essential hypertension 12/16/2017  . Chronic hepatitis C without hepatic coma ([place]) 09/04/2015  . LUQ abdominal pain 09/04/2015  . Simple chronic bronchitis ([place]) 09/04/2015  . Depression 09/04/2015    CC:  New patient - initial evaluation and management of chronic hepatitis C infection.    HPI/ROS:  Jodi Pruitt is a 64 y.o. female   She states she has had hepatitis C for probably 40 years. Suspects she contracted in the 70s due to either drug use or sexual risk. All of this is distantly remote and nothing active now.   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 States she gave hep c to her son in vitro - he is 73 and has not had treatment yet d/t cost.   She does drink alcohol pretty regularly and admits she needs to decrease use. She has had very high stress at work and with family members health struggles. She states she used to do counseling sessions but stopped d/t virtual visits becoming ineffective and she had her phone turned off for a period of time. She feels that she would potentially benefit from a different provider. Feels that she drinks alcohol for stress management. Drinks more than 1/5th of gin and 1/5th of another liquor over the course of 1 week.  Not currently in a sexual relationship.  She does have fatigue and lost weight due to stress.   Patient does not have documented immunity to Hepatitis A. Patient does not have documented immunity to Hepatitis B.    Constitutional: negative for fevers and chills; positive for weight loss and fatigue  Eyes: negative for icterus Cardiovascular: negative for chest pain, lower extremity edema Gastrointestinal: negative for change in bowel  habits, diarrhea, abdominal pain and jaundice Musculoskeletal: negative for arthralgias Neurological: negative for paresthesia; positive for mechanical falls at work due to darkness a/w night shift Behavioral/Psych: negative for illegal drug usage; positive for excessive alcohol consumption, depression and anxiety  All other systems reviewed and are negative      Past Medical History:  Diagnosis Date  . Anxiety   . Arthritis   . Depression   . Hepatitis C, chronic ([place])   . HTN (hypertension)     Prior to Admission medications   Medication Sig Start Date End Date Taking? Authorizing Provider  losartan (COZAAR) 50 MG tablet Take 50 mg by mouth daily. 01/17/20  Yes [provider]  Multiple Vitamin (MULTIVITAMIN) tablet Take 1 tablet by mouth daily. Reported on 09/04/2015   Yes [provider]  Naproxen Sodium (ALEVE PO) Take by mouth as needed. Reported on 09/04/2015   Yes [provider]  sertraline (ZOLOFT) 100 MG tablet TAKE 1 AND 1/2 TABLETS BY  MOUTH DAILY 11/29/19  Yes Rutherford Guys, MD  amLODipine (NORVASC) 10 MG tablet Take 1 tablet (10 mg total) by mouth daily. Patient not taking: Reported on 02/15/2020 11/29/19   Rutherford Guys, MD    Allergies  Allergen Reactions  . Compazine [Prochlorperazine Edisylate] Other (See Comments)    HALLUCINATION    Social History   Tobacco Use  . Smoking status: Current Every Day Smoker    Packs/day: 1.50    Years: 40.00  Pack years: 60.00    Types: Cigarettes  . Smokeless tobacco: Never Used  Substance Use Topics  . Alcohol use: Yes    Comment: DRINK WINE  . Drug use: Never    Family History  Problem Relation Age of Onset  . Hypertension Mother   . Heart disease Mother   . Hyperlipidemia Mother   . Stroke Mother   . Parkinson's disease Father   . Heart disease Father   . Cancer Sister   . Cancer Maternal Grandmother     Objective:   Vitals:   02/15/20 1414  BP: (!) 157/92  Pulse: 90    Temp: 98.3 F (36.8 C)   Constitutional: in no apparent distress, oriented times 3 and comfortably seated in chair Eyes: anicteric Cardiovascular: Cor RRR and No murmurs Respiratory: clear Gastrointestinal: Bowel sounds are normal, liver is not enlarged, spleen is not enlarged Musculoskeletal: peripheral pulses normal, no pedal edema, no clubbing or cyanosis Skin: negative for - jaundice, spider hemangioma, telangiectasia, palmar erythema, ecchymosis and atrophy; no porphyria cutanea tarda Lymphatic: no cervical lymphadenopathy   Laboratory: Genotype: No results found for: HCVGENOTYPE HCV viral load:  Lab Results  Component Value Date   HCVQUANT 9,470,962 (H) 09/04/2015   Lab Results  Component Value Date   WBC 8.9 11/29/2019   HGB 16.5 (H) 11/29/2019   HCT 49.8 (H) 11/29/2019   MCV 96 11/29/2019   PLT 253 11/29/2019    Lab Results  Component Value Date   CREATININE 0.77 11/29/2019   BUN 11 11/29/2019   NA 145 (H) 11/29/2019   K 4.9 11/29/2019   CL 103 11/29/2019   CO2 22 11/29/2019    Lab Results  Component Value Date   ALT 31 11/29/2019   AST 49 (H) 11/29/2019   ALKPHOS 84 11/29/2019    Lab Results  Component Value Date   INR 1.0 12/27/2018   BILITOT 0.5 11/29/2019   ALBUMIN 4.3 11/29/2019    APRI 0.484  FIB-4 2.30  AFP WNL 11-2019  Imaging:  pending  Assessment & Plan:   Problem List Items Addressed This Visit      Unprioritized   Depression - Primary    Chronic with relapse recently given stress at home and work. We discussed referral to new psychologist / counselor. Will refer to [facility] for care and see if she can meet with Jessica Priest.       Relevant Orders   Ambulatory referral to Psychology   Chronic hepatitis C without hepatic coma ([place])    New Patient with Chronic Hepatitis C genotype unknown, treatment naive.  Fibrosis risk with APRI and FIB4 are discordant - will plan Fibroscan and Fibrotest for further assessment.  No findings on exam concerning for cirrhosis today.   I discussed with the patient the lab findings that confirm chronic hepatitis C as well as the natural history and progression of disease including about 30% of people who develop cirrhosis of the liver if left untreated and once cirrhosis is established there is a 2-7% risk per year of liver cancer and liver failure.  I discussed the importance of treatment and benefits in reducing the risk, even if significant liver fibrosis exists. I also discussed risk for re-infection following treatment should he not continue to modify risk factors.    Patient counseled extensively on limiting acetaminophen to no more than 2 grams daily, avoidance of alcohol. She is not able to quit physically at this time so we created a plan  to decrease to 2 shots of liquor a night, she will also commit to measuring out liquor.   Transmission discussed with patient including sexual transmission, sharing razors and toothbrush.   Will need referral to gastroenterology if concern for cirrhosis   Will prescribe appropriate medication based on genotype and coverage   Hepatitis A and B titers to be drawn today with appropriate vaccinations as needed   Pneumovax vaccine at upcoming visit if not previously given   Will call Jodi Pruitt back once all results are in and counsel on medication over the phone. She will return 4 weeks after starting to meet with pharmacy team and check RNA at that time.        Relevant Orders   Liver Fibrosis, FibroTest-ActiTest   Hepatitis C genotype   Protime-INR   US ABDOMEN RUQ W/ELASTOGRAPHY   Alcohol consumption heavy    Patient counseled on importance of avoidance of alcohol. She is not able to quit physically at this time so we created a plan to decrease to 2 shots of liquor a night, she will also commit to measuring out liquor.  She is interested in quitting - hopefully if depression and stress management can be  addressed this will help support her to quit.          Janene Madeira, MSN, NP-C [email or web address] Pager: [phone] Office: [place]: [phone]

## 2020-02-15 NOTE — Telephone Encounter (Signed)
[facility] Patient Teacher, English as a foreign language completed.    The patient is insured through [employer].  Once prior authorization is approved, she will be required to use [facility] to fill the medication.  [phone]  We will continue to follow to see if copay assistance is needed.  Jodi Pruitt. Nadara Mustard [place] Patient [name] Phone: [phone] Fax:  [phone]

## 2020-02-15 NOTE — Patient Instructions (Signed)
Nice to meet you today!    We need to get a little more information about your hepatitis c infection before we start your treatment. I anticipate that we can get you started in a few weeks after we submit approval to your insurance to ensure payment. We may need to place referral for an ultrasound and/or gastroenterology if your blood work indicates more damage to the liver than expected.     ABOUT HEPATITIS C VIRUS:   Chronic Hepatitis C is the most common blood-borne infection in the [place], affecting approximately 3 million people.   It is the leading cause of cirrhosis, liver cancer, and end stage liver disease requiring transplantation when this infection goes untreated for many years   The majority of people who are infected are unaware because there are not many early symptoms that are specific to this and often go undiagnosed until a specific blood test is drawn.    The hepatitis c virus is passed primarily through direct exposure of contaminated blood or body fluids. It is most efficiently transmitted through repeated exposure to infected blood.   Risk for sexual transmission is very low but is possible if there is high frequency of unprotected sexual activity with known hepatitis c partner or multiple partners of known status.   Over time, approximately 60-70% of people can develop some degree of liver disease. Cirrhosis occurs in 10-20% of those with chronic infection. 1-5% will get liver cancer, which has a very high rate of death.    Approximately 15-25% clear the infection without medication (usually in the first 6 months of becoming exposed to virus)   Newer medications provide over 95% cure rate when taken as prescribed    IN GENERAL ABOUT DIET  . Persons living with chronic hepatitis c infection should eat a diet to maintain a healthy weight and avoid nutritional deficiencies.   . Completely avoiding alcohol is the best decision for your liver health. If  unable to do so please limit alcohol to as little as possible to less than 1 standard drink a day - this is very irritating to your liver.  . Limit tylenol use to less than 2,000 mg daily (two extra strength tablets only twice a day)  . If you have cirrhosis of the liver please take no more than 1,000 mg tylenol a day  . Patients with cirrhosis should not have protein restriction; we recommend a protein intake of approximately 1.2-1.5 g/kg/day.   . For patients with cirrhosis and hepatic encephalopathy, the American Association for the Study of Liver Diseases (AASLD) recommended protein intake is 1.2-1.5 g/kg/day.  . If you experience ascites (fluid accumulation in the abdomen associated with severe liver damage / cirrhosis) please limit sodium intake to < 2000 mg a day    UNTIL YOU HAVE BEEN TREATED AND CURED:  . Use condoms with all sexual encounters or practice abstinence to avoid sexual transmission   . No sharing of razors, toothbrushes, nail clippers or anything that could potentially have blood on it.   . If you cut yourself please clean and cover any wounds or open sores to others do not come into contact with your blood.   . If blood spills onto item/surface please clean with 1:10 bleach solution and allow to dry, EVEN if it is dried blood.    GENERAL HELPFUL HINTS ON HCV THERAPY:  1. Stay well-hydrated.  2. Notify the ID Clinic of any changes in your other over-the-counter/herbal or prescription medications.    3. If you miss a dose of your medication, take the missed dose as soon as you remember. Return to your regular time/dose schedule the next day.   4.  Do not stop taking your medications without first talking with your healthcare provider.  5.  You will see our pharmacist-specialist within the first 2 weeks of starting your medication to monitor for any possible side effects.  6.  You will have blood work once during treatment 4 weeks after your first pill. Again  soon after treatment is completed and one final lab 3 months after your last pill to ensure cure!   TIPS TO BE SUCCESSFUL WITH DAILY MEDICATION USE:  1. Set a reminder on your phone  2. Try filling out a pill box for the week - pick a day and put one pill for every day during the week so you know right away if you missed a pill.   3. Have a trusted family member ask you about your medications.   4. Smartphone app    Medication we would like to use for you will be :   Mavyret Instructions:  1. Take Mavyret, three tablets (at the same time) daily with food. Please take ALL THREE PILLS AT ONCE. You should take it at approximately the same time every day. Treatment will be for 8 weeks. Do not miss a dose.    2. Do not run out of [place]! If you are down to one week of medication left and have not heard about your next shipment, please let us know as soon as possible. You will be given 28 days of treatment at a time and will receive one refill.   3. If you need to start a new medication, prescription from your doctor or over the counter medication, you need to contact us to make sure it does not interfere with [place]. There are several medications that can interfere with Mavyret and can make you sick or make the medication not work.  4. If you need to take a medication for acid reflux, you can take omeprazole 20mg  daily.     5. Tylenol (acetominophen) and Advil (ibuprofen) are safe to take with [place] if needed for headache, fever, pain.   6. IF YOU ARE ON BIRTH CONTROL PILLS, YOU RECEIVE A SHOT FOR BIRTH CONTROL, OR YOU HAVE AN IUD, please notify your provider to make sure it is safe with Mavyret.   7. DO NOT stop Mavyret unless instructed to by your provider. If you are hospitalized while taking this medication please bring it with you to the hospital to avoid interruption of therapy. Every pill is important!  8. The most common side effects associated with Mavyret include:   o Fatigue o Headache o Nausea o Diarrhea o Insomnia

## 2020-02-15 NOTE — Assessment & Plan Note (Addendum)
New Patient with Chronic Hepatitis C genotype unknown, treatment naive.  Fibrosis risk with APRI and FIB4 are discordant - will plan Fibroscan and Fibrotest for further assessment. No findings on exam concerning for cirrhosis today.   I discussed with the patient the lab findings that confirm chronic hepatitis C as well as the natural history and progression of disease including about 30% of people who develop cirrhosis of the liver if left untreated and once cirrhosis is established there is a 2-7% risk per year of liver cancer and liver failure.  I discussed the importance of treatment and benefits in reducing the risk, even if significant liver fibrosis exists. I also discussed risk for re-infection following treatment should he not continue to modify risk factors.    Patient counseled extensively on limiting acetaminophen to no more than 2 grams daily, avoidance of alcohol. She is not able to quit physically at this time so we created a plan to decrease to 2 shots of liquor a night, she will also commit to measuring out liquor.   Transmission discussed with patient including sexual transmission, sharing razors and toothbrush.   Will need referral to gastroenterology if concern for cirrhosis   Will prescribe appropriate medication based on genotype and coverage   Hepatitis A and B titers to be drawn today with appropriate vaccinations as needed   Pneumovax vaccine at upcoming visit if not previously given   Will call Jennilyn L Ransom-Smith back once all results are in and counsel on medication over the phone. She will return 4 weeks after starting to meet with pharmacy team and check RNA at that time.

## 2020-02-19 LAB — HEPATITIS C GENOTYPE

## 2020-02-19 LAB — PROTIME-INR
INR: 1
Prothrombin Time: 10.4 s (ref 9.0–11.5)

## 2020-02-19 LAB — LIVER FIBROSIS, FIBROTEST-ACTITEST
ALT: 22 U/L (ref 6–29)
Alpha-2-Macroglobulin: 307 mg/dL — ABNORMAL HIGH (ref 106–279)
Apolipoprotein A1: 164 mg/dL (ref 101–198)
Bilirubin: 0.6 mg/dL (ref 0.2–1.2)
Fibrosis Score: 0.49
GGT: 65 U/L (ref 3–65)
Haptoglobin: 143 mg/dL (ref 43–212)
Necroinflammat ACT Score: 0.12
Reference ID: 3529238

## 2020-02-20 ENCOUNTER — Ambulatory Visit (HOSPITAL_COMMUNITY)
Admission: RE | Admit: 2020-02-20 | Discharge: 2020-02-20 | Disposition: A | Discharge status: Home / Self Care | Payer: Commercial Managed Care - PPO | Source: Ambulatory Visit | Attending: Infectious Diseases | Admitting: Infectious Diseases

## 2020-02-20 ENCOUNTER — Other Ambulatory Visit: Payer: Self-pay

## 2020-02-20 DIAGNOSIS — B182 Chronic viral hepatitis C: Secondary | ICD-10-CM | POA: Diagnosis not present

## 2020-02-21 ENCOUNTER — Ambulatory Visit: Payer: Commercial Managed Care - PPO

## 2020-02-21 ENCOUNTER — Other Ambulatory Visit: Payer: Self-pay | Admitting: Pharmacist

## 2020-02-21 DIAGNOSIS — B182 Chronic viral hepatitis C: Secondary | ICD-10-CM

## 2020-02-21 MED ORDER — MAVYRET 100-40 MG PO TABS: 3.0000 | ORAL_TABLET | Freq: Every day | ORAL | 1 refills | Status: DC

## 2020-02-21 NOTE — Progress Notes (Signed)
Genotype 1a VL 4.9 million  F2 with normal appearing FibroScan  Can we look into starting [place] for her please?

## 2020-02-21 NOTE — Progress Notes (Signed)
Sure thing!

## 2020-02-22 ENCOUNTER — Other Ambulatory Visit: Payer: Self-pay | Admitting: Pharmacist

## 2020-02-22 DIAGNOSIS — B182 Chronic viral hepatitis C: Secondary | ICD-10-CM

## 2020-02-22 MED ORDER — MAVYRET 100-40 MG PO TABS: 3.0000 | ORAL_TABLET | Freq: Every day | ORAL | 1 refills | Status: DC

## 2020-02-22 NOTE — Progress Notes (Signed)
Patient's insurance requires Mavyret to be filled at [facility]. Resending Rx now. Jodi Pruitt will call patient and coordinate.

## 2020-02-28 ENCOUNTER — Telehealth: Payer: Self-pay | Admitting: Pharmacy Technician

## 2020-02-28 NOTE — Telephone Encounter (Signed)
[facility] Patient Advocate Encounter   Received notification from [employer] that prior authorization for Epclusa generic is required (this is preferred medication over [place]).  Forms were faxed.   PA submitted on 02/28/2020 Status is pending    [facility] will continue to follow.   Jodi Pruitt. Jodi Pruitt Patient [facility] for Infectious Disease Phone: [phone] Fax:  [phone]

## 2020-03-01 ENCOUNTER — Other Ambulatory Visit: Payer: Self-pay

## 2020-03-01 ENCOUNTER — Ambulatory Visit (INDEPENDENT_AMBULATORY_CARE_PROVIDER_SITE_OTHER): Payer: Commercial Managed Care - PPO | Admitting: Family Medicine

## 2020-03-01 ENCOUNTER — Encounter: Payer: Self-pay | Admitting: Family Medicine

## 2020-03-01 ENCOUNTER — Ambulatory Visit: Payer: Commercial Managed Care - PPO

## 2020-03-01 VITALS — BP 147/87 | HR 83 | Temp 98.2°F | Ht 63.0 in | Wt 145.2 lb

## 2020-03-01 DIAGNOSIS — Z780 Asymptomatic menopausal state: Secondary | ICD-10-CM

## 2020-03-01 DIAGNOSIS — Z860100 Personal history of colon polyps, unspecified: Secondary | ICD-10-CM

## 2020-03-01 DIAGNOSIS — Z0001 Encounter for general adult medical examination with abnormal findings: Secondary | ICD-10-CM | POA: Diagnosis not present

## 2020-03-01 DIAGNOSIS — Z1211 Encounter for screening for malignant neoplasm of colon: Secondary | ICD-10-CM

## 2020-03-01 DIAGNOSIS — Z23 Encounter for immunization: Secondary | ICD-10-CM

## 2020-03-01 DIAGNOSIS — I10 Essential (primary) hypertension: Secondary | ICD-10-CM | POA: Diagnosis not present

## 2020-03-01 DIAGNOSIS — Z1231 Encounter for screening mammogram for malignant neoplasm of breast: Secondary | ICD-10-CM

## 2020-03-01 DIAGNOSIS — Z0000 Encounter for general adult medical examination without abnormal findings: Secondary | ICD-10-CM

## 2020-03-01 MED ORDER — AMLODIPINE BESYLATE 10 MG PO TABS: 10.0000 mg | ORAL_TABLET | Freq: Every day | ORAL | 1 refills | Status: AC

## 2020-03-01 MED ORDER — ATORVASTATIN CALCIUM 10 MG PO TABS: 10.0000 mg | ORAL_TABLET | Freq: Every day | ORAL | 1 refills | Status: AC

## 2020-03-01 MED ORDER — SERTRALINE HCL 100 MG PO TABS
150.0000 mg | ORAL_TABLET | Freq: Every day | ORAL | 1 refills | Status: DC
Start: 2020-03-01 — End: 2021-02-19

## 2020-03-01 MED ORDER — LOSARTAN POTASSIUM 50 MG PO TABS
50.0000 mg | ORAL_TABLET | Freq: Every day | ORAL | 1 refills | Status: DC
Start: 2020-03-01 — End: 2020-09-05

## 2020-03-01 NOTE — Patient Instructions (Addendum)
2nd dose of shingrix in 2-6 months   Preventive Care 66 Years and Older, Female Preventive care refers to lifestyle choices and visits with your health care provider that can promote health and wellness. This includes:  A yearly physical exam. This is also called an annual well check.  Regular dental and eye exams.  Immunizations.  Screening for certain conditions.  Healthy lifestyle choices, such as diet and exercise. What can I expect for my preventive care visit? Physical exam Your health care provider will check:  Height and weight. These may be used to calculate body mass index (BMI), which is a measurement that tells if you are at a healthy weight.  Heart rate and blood pressure.  Your skin for abnormal spots. Counseling Your health care provider may ask you questions about:  Alcohol, tobacco, and drug use.  Emotional well-being.  Home and relationship well-being.  Sexual activity.  Eating habits.  History of falls.  Memory and ability to understand (cognition).  Work and work Statistician.  Pregnancy and menstrual history. What immunizations do I need?  Influenza (flu) vaccine  This is recommended every year. Tetanus, diphtheria, and pertussis (Tdap) vaccine  You may need a Td booster every 10 years. Varicella (chickenpox) vaccine  You may need this vaccine if you have not already been vaccinated. Zoster (shingles) vaccine  You may need this after age 76. Pneumococcal conjugate (PCV13) vaccine  One dose is recommended after age 66. Pneumococcal polysaccharide (PPSV23) vaccine  One dose is recommended after age 56. Measles, mumps, and rubella (MMR) vaccine  You may need at least one dose of MMR if you were born in 1957 or later. You may also need a second dose. Meningococcal conjugate (MenACWY) vaccine  You may need this if you have certain conditions. Hepatitis A vaccine  You may need this if you have certain conditions or if you travel or  work in places where you may be exposed to hepatitis A. Hepatitis B vaccine  You may need this if you have certain conditions or if you travel or work in places where you may be exposed to hepatitis B. Haemophilus influenzae type b (Hib) vaccine  You may need this if you have certain conditions. You may receive vaccines as individual doses or as more than one vaccine together in one shot (combination vaccines). Talk with your health care provider about the risks and benefits of combination vaccines. What tests do I need? Blood tests  Lipid and cholesterol levels. These may be checked every 5 years, or more frequently depending on your overall health.  Hepatitis C test.  Hepatitis B test. Screening  Lung cancer screening. You may have this screening every year starting at age 66 if you have a 30-pack-year history of smoking and currently smoke or have quit within the past 15 years.  Colorectal cancer screening. All adults should have this screening starting at age 102 and continuing until age 20. Your health care provider may recommend screening at age 79 if you are at increased risk. You will have tests every 1-10 years, depending on your results and the type of screening test.  Diabetes screening. This is done by checking your blood sugar (glucose) after you have not eaten for a while (fasting). You may have this done every 1-3 years.  Mammogram. This may be done every 1-2 years. Talk with your health care provider about how often you should have regular mammograms.  BRCA-related cancer screening. This may be done if you have a  family history of breast, ovarian, tubal, or peritoneal cancers. Other tests  Sexually transmitted disease (STD) testing.  Bone density scan. This is done to screen for osteoporosis. You may have this done starting at age 40. Follow these instructions at home: Eating and drinking  Eat a diet that includes fresh fruits and vegetables, whole grains, lean  protein, and low-fat dairy products. Limit your intake of foods with high amounts of sugar, saturated fats, and salt.  Take vitamin and mineral supplements as recommended by your health care provider.  Do not drink alcohol if your health care provider tells you not to drink.  If you drink alcohol: ? Limit how much you have to 0-1 drink a day. ? Be aware of how much alcohol is in your drink. In the [place] one drink equals one 12 oz bottle of beer (355 mL), one 5 oz glass of wine (148 mL), or one 1 oz glass of hard liquor (44 mL). Lifestyle  Take daily care of your teeth and gums.  Stay active. Exercise for at least 30 minutes on 5 or more days each week.  Do not use any products that contain nicotine or tobacco, such as cigarettes, e-cigarettes, and chewing tobacco. If you need help quitting, ask your health care provider.  If you are sexually active, practice safe sex. Use a condom or other form of protection in order to prevent STIs (sexually transmitted infections).  Talk with your health care provider about taking a low-dose aspirin or statin. What's next?  Go to your health care provider once a year for a well check visit.  Ask your health care provider how often you should have your eyes and teeth checked.  Stay up to date on all vaccines. This information is not intended to replace advice given to you by your health care provider. Make sure you discuss any questions you have with your health care provider. Document Revised: 06/03/2018 Document Reviewed: 06/03/2018 Elsevier Patient Education  [employer].    If you have lab work done today you will be contacted with your lab results within the next 2 weeks.  If you have not heard from [place] then please contact us. The fastest way to get your results is to register for My Chart.   IF you received an x-ray today, you will receive an invoice from [facility]. Please contact [facility] at [phone] with  questions or concerns regarding your invoice.   IF you received labwork today, you will receive an invoice from [facility]. Please contact [facility] at [phone] with questions or concerns regarding your invoice.   Our billing staff will not be able to assist you with questions regarding bills from these companies.  You will be contacted with the lab results as soon as they are available. The fastest way to get your results is to activate your My Chart account. Instructions are located on the last page of this paperwork. If you have not heard from [place] regarding the results in 2 weeks, please contact this office.

## 2020-03-01 NOTE — Progress Notes (Signed)
9/9/20213:10 PM  Jodi Pruitt 01/03/1956, 64 y.o., female [id number]  Chief Complaint  Patient presents with  . Annual Exam    HPI:   Patient is a 64 y.o. female with past medical history significant for HTN, HLP, HCV, depression who presents today for CPE  Last CPE aug 2019  Cervical Cancer Screening: last pap 08/2015, normal, n/a age Breast Cancer Screening: sister with breast ca, due Colorectal Cancer Screening: h/o polyps Bone Density Testing: at age 53 Seasonal Influenza Vaccination: gets at work Td/Tdap Vaccination: gets at work Pneumococcal Vaccination: due today Shingrix: requesting today Frequency of Dental evaluation: Q6 months Frequency of Eye evaluation: wears eyeglasses   Hearing Screening   125Hz  250Hz  500Hz  1000Hz  2000Hz  3000Hz  4000Hz  6000Hz  8000Hz   Right ear:           Left ear:             Visual Acuity Screening   Right eye Left eye Both eyes  Without correction:     With correction: 20/30 20/30 20/30     Depression screen [facility] 2/9 11/29/2019 12/27/2018 10/28/2018  Decreased Interest 0 2 3  Down, Depressed, Hopeless 0 2 3  PHQ - 2 Score 0 4 6  Altered sleeping - 3 1  Tired, decreased energy - 2 3  Change in appetite - 3 0  Feeling bad or failure about yourself  - 2 3  Trouble concentrating - 0 0  Moving slowly or fidgety/restless - 0 0  Suicidal thoughts - 0 0  PHQ-9 Score - 14 13  Difficult doing work/chores - Somewhat difficult -    Fall Risk  03/01/2020 11/29/2019 12/27/2018 10/28/2018 02/02/2018  Falls in the past year? 0 1 0 0 No  Number falls in past yr: 0 1 0 - -  Injury with Fall? 0 0 0 - -  Follow up - Falls evaluation completed - - -     Allergies  Allergen Reactions  . Compazine [Prochlorperazine Edisylate] Other (See Comments)    HALLUCINATION    Prior to Admission medications   Medication Sig Start Date End Date Taking? Authorizing Provider  amlodipine-atorvastatin (CADUET) 10-10 MG tablet Take 1 tablet by mouth daily.    Yes [provider]  Glecaprevir-Pibrentasvir (MAVYRET) 100-40 MG TABS Take 3 tablets by mouth daily with breakfast. 02/22/20  Yes Kuppelweiser, Cassie L, RPH-CPP  Multiple Vitamin (MULTIVITAMIN) tablet Take 1 tablet by mouth daily. Reported on 09/04/2015   Yes [provider]  Naproxen Sodium (ALEVE PO) Take by mouth as needed. Reported on 09/04/2015   Yes [provider]  sertraline (ZOLOFT) 100 MG tablet TAKE 1 AND 1/2 TABLETS BY  MOUTH DAILY 11/29/19  Yes Rutherford Guys, MD    Past Medical History:  Diagnosis Date  . Anxiety   . Arthritis   . Depression   . Hepatitis C, chronic ([place])   . HTN (hypertension)   . Substance abuse ([place])    Phreesia 02/28/2020    Past Surgical History:  Procedure Laterality Date  . CESAREAN SECTION    . FRACTURE SURGERY    . NEPHRECTOMY      Social History   Tobacco Use  . Smoking status: Current Every Day Smoker    Packs/day: 1.50    Years: 40.00    Pack years: 60.00    Types: Cigarettes  . Smokeless tobacco: Never Used  Substance Use Topics  . Alcohol use: Yes    Comment: [place] WINE    Family History  Problem Relation Age of Onset  . Hypertension Mother   . Heart disease Mother   . Hyperlipidemia Mother   . Stroke Mother   . Parkinson's disease Father   . Heart disease Father   . Cancer Sister   . Cancer Maternal Grandmother     Review of Systems  Constitutional: Positive for malaise/fatigue. Negative for chills and fever.  Respiratory: Negative for cough and shortness of breath.   Cardiovascular: Negative for chest pain, palpitations and leg swelling.  Gastrointestinal: Negative for abdominal pain, blood in stool, melena, nausea and vomiting.       Has seen ID for HCV, pending arrival of antiviral med  Genitourinary: Negative for dysuria and hematuria.  Neurological: Negative for dizziness and headaches.  Psychiatric/Behavioral: Positive for depression (well controlled on sertraline). The patient does  not have insomnia.   All other systems reviewed and are negative.    OBJECTIVE:  Today's Vitals   03/01/20 1454  BP: (!) 147/87  Pulse: 83  Temp: 98.2 F (36.8 C)  SpO2: 97%  Weight: 145 lb 3.2 oz (65.9 kg)  Height: 5\' 3"  (1.6 m)   Body mass index is 25.72 kg/m.   BP Readings from Last 3 Encounters:  03/01/20 (!) 147/87  02/15/20 (!) 157/92  11/29/19 (!) 169/91     Physical Exam Vitals and nursing note reviewed.  Constitutional:      Appearance: She is well-developed.  HENT:     Head: Normocephalic and atraumatic.     Right Ear: Hearing, tympanic membrane, ear canal and external ear normal.     Left Ear: Hearing, tympanic membrane, ear canal and external ear normal.     Mouth/Throat:     Mouth: Mucous membranes are moist.     Pharynx: No oropharyngeal exudate or posterior oropharyngeal erythema.  Eyes:     Extraocular Movements: Extraocular movements intact.     Conjunctiva/sclera: Conjunctivae normal.     Pupils: Pupils are equal, round, and reactive to light.  Neck:     Thyroid: No thyromegaly.  Cardiovascular:     Rate and Rhythm: Normal rate and regular rhythm.     Heart sounds: Normal heart sounds. No murmur heard.  No friction rub. No gallop.   Pulmonary:     Effort: Pulmonary effort is normal.     Breath sounds: Normal breath sounds. No wheezing, rhonchi or rales.  Abdominal:     General: Bowel sounds are normal. There is no distension.     Palpations: Abdomen is soft. There is no hepatomegaly, splenomegaly or mass.     Tenderness: There is no abdominal tenderness.  Musculoskeletal:        General: Normal range of motion.     Cervical back: Neck supple.     Right lower leg: No edema.     Left lower leg: No edema.  Lymphadenopathy:     Cervical: No cervical adenopathy.  Skin:    General: Skin is warm and dry.  Neurological:     Mental Status: She is alert and oriented to person, place, and time.     Cranial Nerves: No cranial nerve deficit.      Gait: Gait normal.     Deep Tendon Reflexes: Reflexes are normal and symmetric.  Psychiatric:        Mood and Affect: Mood normal.        Behavior: Behavior normal.     No results found for this or any previous visit (from the past 24 hour(s)).  No results found.   ASSESSMENT and PLAN  1. Annual physical exam No concerns per history or exam. Routine HCM labs ordered. HCM reviewed/discussed. Anticipatory guidance regarding healthy weight, lifestyle and choices given.   2. Need for zoster vaccine  3. Need for vaccination - Pneumococcal conjugate vaccine 13-valent IM  4. Postmenopausal estrogen deficiency - DG Bone Density; Future  5. Essential hypertension Above goal, patient ran out of amlodipine.   6. Screening mammogram, encounter for - MM Digital Screening; Future  7. Colon cancer screening - Ambulatory referral to Gastroenterology  8. History of colonic polyps - Ambulatory referral to Gastroenterology  Other orders - Varicella-zoster vaccine IM (Shingrix) - losartan (COZAAR) 50 MG tablet; Take 1 tablet (50 mg total) by mouth daily. - amLODipine (NORVASC) 10 MG tablet; Take 1 tablet (10 mg total) by mouth daily. - atorvastatin (LIPITOR) 10 MG tablet; Take 1 tablet (10 mg total) by mouth daily. - sertraline (ZOLOFT) 100 MG tablet; Take 1.5 tablets (150 mg total) by mouth daily.  Return in about 6 months (around 08/29/2020) for HTN, HLP.    Rutherford Guys, MD Primary Care at [place], [place] [address] Ph.  [phone] Fax [phone]

## 2020-03-05 ENCOUNTER — Encounter: Payer: Self-pay | Admitting: Gastroenterology

## 2020-03-08 ENCOUNTER — Ambulatory Visit: Payer: Commercial Managed Care - PPO

## 2020-03-08 ENCOUNTER — Other Ambulatory Visit: Payer: Self-pay

## 2020-03-20 ENCOUNTER — Telehealth: Payer: Self-pay

## 2020-03-20 NOTE — Telephone Encounter (Signed)
[facility] Patient Advocate Encounter  Completed and sent [place] application for [place] for this patient who is insured.    Patient assistance phone number for follow up is [phone].   This encounter will be updated until final determination.   Ileene Patrick, [facility] Patient [facility] for Infectious Disease Phone: [phone] Fax:  [phone]

## 2020-03-21 ENCOUNTER — Telehealth: Payer: Self-pay

## 2020-03-21 NOTE — Telephone Encounter (Signed)
Pt Assistance needs additional info.Proof of income for all households members such as a federal tax return form 1040, [place] form, paystubs, 1099 form, or benefit  award letter.  Called & left a message for pt to call back.      Ileene Patrick CPHT

## 2020-03-22 ENCOUNTER — Other Ambulatory Visit: Payer: Self-pay

## 2020-03-22 ENCOUNTER — Ambulatory Visit: Payer: Commercial Managed Care - PPO

## 2020-04-05 ENCOUNTER — Ambulatory Visit: Payer: Commercial Managed Care - PPO

## 2020-04-19 ENCOUNTER — Other Ambulatory Visit: Payer: Self-pay

## 2020-04-19 ENCOUNTER — Ambulatory Visit: Payer: Commercial Managed Care - PPO

## 2020-04-24 ENCOUNTER — Telehealth: Payer: Self-pay

## 2020-04-24 NOTE — Telephone Encounter (Signed)
[facility] Patient Advocate Encounter  Patient was supposed to bring/ fax income in for [facility] , However she is not willing to share that information with anyone at the present time.   Jodi Pruitt, [facility] Patient [facility] for Infectious Disease Phone: [phone] Fax:  [phone]

## 2020-04-25 NOTE — Telephone Encounter (Signed)
Jodi Pruitt.Marland Kitchen looks like she won't share the necessary information for patient assistance with her Hep C med. She has insurance but they kept denying our PA and appeals. She states that she is getting Part D in a few months and will wait until then.

## 2020-04-25 NOTE — Telephone Encounter (Signed)
OK that should be fine. No evidence of cirrhosis or urgency for the time being.

## 2020-05-01 ENCOUNTER — Other Ambulatory Visit: Payer: Self-pay

## 2020-05-01 ENCOUNTER — Ambulatory Visit: Payer: Commercial Managed Care - PPO

## 2020-05-01 ENCOUNTER — Ambulatory Visit (AMBULATORY_SURGERY_CENTER): Payer: Self-pay | Admitting: *Deleted

## 2020-05-01 VITALS — Ht 63.0 in | Wt 149.0 lb

## 2020-05-01 DIAGNOSIS — Z1211 Encounter for screening for malignant neoplasm of colon: Secondary | ICD-10-CM

## 2020-05-01 MED ORDER — SUPREP BOWEL PREP KIT 17.5-3.13-1.6 GM/177ML PO SOLN: 1.0000 | Freq: Once | ORAL | 0 refills | Status: AC

## 2020-05-01 NOTE — Progress Notes (Incomplete)
Mental Health Therapist Progress Note   Name: Jodi Pruitt  Total time: {IBH Total Time:21014050}  Type of Service: Individual Outpatient Mental Health Therapy  OBJECTIVE:  Mood: {BHH MOOD:22306} and Affect: {BHH AFFECT:22307} Risk of harm to self or others: {CHL AMB BH Suicide Current Mental Status:21022748}  DIAGNOSIS:   GOALS ADDRESSED:  Patient will: 1.  Reduce symptoms of: {IBH Symptoms:21014056}  2.  Increase knowledge and/or ability of: {IBH Patient Tools:21014057}  3.  Demonstrate ability to: {IBH Goals:21014053}  INTERVENTIONS: Interventions utilized:  {IBH Interventions:21014054} Therapist met with patient for outpatient mental health individual therapy to include ongoing assessment, support, and reinforcement.  Therapist allowed patient to "check in" since previous session; asking patient to share any positive coping skills they may have used over the previous week, along with any challenges faced.  Therapist provided supportive listening as patient processed their thoughts, emotional responses, and behaviors surrounding several stressors.  EFFECTIVENESS/PLAN: Client was alert, oriented x3, with no SI, HI, or symptoms of psychosis (risk low).  Client was pleasant and friendly, engaging openly and appropriately with therapist, benefiting from supportive listening and exploration of feelings.  Next session recommended in one to two weeks.   Hughes Better, LCSW

## 2020-05-01 NOTE — Progress Notes (Signed)
cov vax x 2   No egg or soy allergy known to patient  No issues with past sedation with any surgeries or procedures no intubation problems in the past  No FH of Malignant Hyperthermia No diet pills per patient No home 02 use per patient  No blood thinners per patient  Pt denies issues with constipation  No A fib or A flutter  EMMI video to pt or via [facility] 19 guidelines implemented in PV today with Pt and RN   Suprep  Coupon given to pt in PV today , Code to Pharmacy   Due to the COVID-19 pandemic we are asking patients to follow these guidelines. Please only bring one care partner. Please be aware that your care partner may wait in the car in the parking lot or if they feel like they will be too hot to wait in the car, they may wait in the lobby on the 4th floor. All care partners are required to wear a mask the entire time (we do not have any that we can provide them), they need to practice social distancing, and we will do a Covid check for all patient's and care partners when you arrive. Also we will check their temperature and your temperature. If the care partner waits in their car they need to stay in the parking lot the entire time and we will call them on their cell phone when the patient is ready for discharge so they can bring the car to the front of the building. Also all patient's will need to wear a mask into building.

## 2020-05-02 ENCOUNTER — Encounter: Payer: Self-pay | Admitting: Gastroenterology

## 2020-05-09 ENCOUNTER — Encounter: Payer: Self-pay | Admitting: Gastroenterology

## 2020-05-09 ENCOUNTER — Other Ambulatory Visit: Payer: Self-pay

## 2020-05-09 ENCOUNTER — Ambulatory Visit (AMBULATORY_SURGERY_CENTER): Payer: Commercial Managed Care - PPO | Admitting: Gastroenterology

## 2020-05-09 VITALS — BP 159/77 | HR 79 | Temp 98.0°F | Resp 19 | Ht 63.0 in | Wt 149.0 lb

## 2020-05-09 DIAGNOSIS — D123 Benign neoplasm of transverse colon: Secondary | ICD-10-CM

## 2020-05-09 DIAGNOSIS — D122 Benign neoplasm of ascending colon: Secondary | ICD-10-CM

## 2020-05-09 DIAGNOSIS — Z1211 Encounter for screening for malignant neoplasm of colon: Secondary | ICD-10-CM | POA: Diagnosis present

## 2020-05-09 DIAGNOSIS — K641 Second degree hemorrhoids: Secondary | ICD-10-CM

## 2020-05-09 DIAGNOSIS — D120 Benign neoplasm of cecum: Secondary | ICD-10-CM | POA: Diagnosis not present

## 2020-05-09 DIAGNOSIS — K5730 Diverticulosis of large intestine without perforation or abscess without bleeding: Secondary | ICD-10-CM

## 2020-05-09 LAB — HM COLONOSCOPY

## 2020-05-09 MED ORDER — SODIUM CHLORIDE 0.9 % IV SOLN: 500.0000 mL | Freq: Once | INTRAVENOUS | Status: DC

## 2020-05-09 NOTE — Progress Notes (Signed)
Called to room to assist during endoscopic procedure.  Patient ID and intended procedure confirmed with present staff. Received instructions for my participation in the procedure from the performing physician.  

## 2020-05-09 NOTE — Op Note (Signed)
[place] Patient Name: Jodi Pruitt Procedure Date: 05/09/2020 9:54 AM MRN: [id number] Endoscopist: Gerrit Heck , MD Age: 64 Referring MD:  Date of Birth: February 15, 1956 Gender: Female Account #: [id number] Procedure:                Colonoscopy Indications:              Screening for colorectal malignant neoplasm (last                            colonoscopy was more than 10 years ago) Medicines:                Monitored Anesthesia Care Procedure:                Pre-Anesthesia Assessment:                           - Prior to the procedure, a History and Physical                            was performed, and patient medications and                            allergies were reviewed. The patient's tolerance of                            previous anesthesia was also reviewed. The risks                            and benefits of the procedure and the sedation                            options and risks were discussed with the patient.                            All questions were answered, and informed consent                            was obtained. Prior Anticoagulants: The patient has                            taken no previous anticoagulant or antiplatelet                            agents. ASA Grade Assessment: II - A patient with                            mild systemic disease. After reviewing the risks                            and benefits, the patient was deemed in                            satisfactory condition to undergo the procedure.  After obtaining informed consent, the colonoscope                            was passed under direct vision. Throughout the                            procedure, the patient's blood pressure, pulse, and                            oxygen saturations were monitored continuously. The                            Colonoscope was introduced through the anus and                            advanced to  the the cecum, identified by                            appendiceal orifice and ileocecal valve. The                            colonoscopy was performed without difficulty. The                            patient tolerated the procedure well. The quality                            of the bowel preparation was good. The ileocecal                            valve, appendiceal orifice, and rectum were                            photographed. Scope In: 10:05:39 AM Scope Out: 10:26:41 AM Scope Withdrawal Time: 0 hours 17 minutes 2 seconds  Total Procedure Duration: 0 hours 21 minutes 2 seconds  Findings:                 Hemorrhoids were found on perianal exam.                           Three sessile polyps were found in the transverse                            colon, ascending colon and cecum. The polyps were 3                            to 6 mm in size. These polyps were removed with a                            cold snare. Resection and retrieval were complete.                            Estimated blood loss was minimal.  A few small-mouthed diverticula were found in the                            sigmoid colon.                           Non-bleeding internal hemorrhoids were found during                            retroflexion. The hemorrhoids were small and Grade                            II (internal hemorrhoids that prolapse but reduce                            spontaneously).                           The ascending colon revealed moderately excessive                            looping. Advancing the scope required using manual                            pressure. Complications:            No immediate complications. Estimated Blood Loss:     Estimated blood loss was minimal. Impression:               - Hemorrhoids found on perianal exam.                           - Three 3 to 6 mm polyps in the transverse colon,                            in the ascending  colon and in the cecum, removed                            with a cold snare. Resected and retrieved.                           - Diverticulosis in the sigmoid colon.                           - Non-bleeding internal hemorrhoids.                           - There was significant looping of the colon. Recommendation:           - Patient has a contact number available for                            emergencies. The signs and symptoms of potential                            delayed complications were discussed with the  patient. Return to normal activities tomorrow.                            Written discharge instructions were provided to the                            patient.                           - Resume previous diet.                           - Continue present medications.                           - Await pathology results.                           - Repeat colonoscopy in 3 - 5 years for                            surveillance based on pathology results.                           - Return to GI office PRN.                           - Use fiber, for example Citrucel, Fibercon, Konsyl                            or Metamucil.                           - Internal hemorrhoids were noted on this study and                            may be amenable to hemorrhoid band ligation. If you                            are interested in further treatment of these                            hemorrhoids with band ligation, please contact my                            clinic to set up an appointment for evaluation and                            treatment. Gerrit Heck, MD 05/09/2020 10:30:51 AM

## 2020-05-09 NOTE — Progress Notes (Signed)
VS by CW  Pt's states no medical or surgical changes since previsit or office visit.  

## 2020-05-09 NOTE — Patient Instructions (Signed)
HANDOUTS PROVIDED ON: High fiber diet, diverticulosis, polyps, hemorrhoids and hemorrhoidal banding  The polyps removed today have been sent for pathology.  The results can take 1-3 weeks to receive.  When your next colonoscopy should occur will be based on the pathology results.    Use fiber, for example Citrucel, Fibercon, Konsyl or Metamucil.    You may resume your previous diet and medication schedule.  Thank you for allowing [place] to care for you today!!!   YOU HAD AN ENDOSCOPIC PROCEDURE TODAY AT [facility]:   Refer to the procedure report that was given to you for any specific questions about what was found during the examination.  If the procedure report does not answer your questions, please call your gastroenterologist to clarify.  If you requested that your care partner not be given the details of your procedure findings, then the procedure report has been included in a sealed envelope for you to review at your convenience later.  YOU SHOULD EXPECT: Some feelings of bloating in the abdomen. Passage of more gas than usual.  Walking can help get rid of the air that was put into your GI tract during the procedure and reduce the bloating. If you had a lower endoscopy (such as a colonoscopy or flexible sigmoidoscopy) you may notice spotting of blood in your stool or on the toilet paper. If you underwent a bowel prep for your procedure, you may not have a normal bowel movement for a few days.  Please Note:  You might notice some irritation and congestion in your nose or some drainage.  This is from the oxygen used during your procedure.  There is no need for concern and it should clear up in a day or so.  SYMPTOMS TO REPORT IMMEDIATELY:   Following lower endoscopy (colonoscopy or flexible sigmoidoscopy):  Excessive amounts of blood in the stool  Significant tenderness or worsening of abdominal pains  Swelling of the abdomen that is new, acute  Fever of 100F or  higher   For urgent or emergent issues, a gastroenterologist can be reached at any hour by calling [phone]. Do not use MyChart messaging for urgent concerns.    DIET:  We do recommend a small meal at first, but then you may proceed to your regular diet.  Drink plenty of fluids but you should avoid alcoholic beverages for 24 hours.  ACTIVITY:  You should plan to take it easy for the rest of today and you should NOT DRIVE or use heavy machinery until tomorrow (because of the sedation medicines used during the test).    FOLLOW UP: Our staff will call the number listed on your records 48-72 hours following your procedure to check on you and address any questions or concerns that you may have regarding the information given to you following your procedure. If we do not reach you, we will leave a message.  We will attempt to reach you two times.  During this call, we will ask if you have developed any symptoms of COVID 19. If you develop any symptoms (ie: fever, flu-like symptoms, shortness of breath, cough etc.) before then, please call [phone].  If you test positive for Covid 19 in the 2 weeks post procedure, please call and report this information to [place].    If any biopsies were taken you will be contacted by phone or by letter within the next 1-3 weeks.  Please call us at [phone] if you have not heard about the  biopsies in 3 weeks.    SIGNATURES/CONFIDENTIALITY: You and/or your care partner have signed paperwork which will be entered into your electronic medical record.  These signatures attest to the fact that that the information above on your After Visit Summary has been reviewed and is understood.  Full responsibility of the confidentiality of this discharge information lies with you and/or your care-partner.

## 2020-05-09 NOTE — Progress Notes (Signed)
pt tolerated well. VSS. awake and to recovery. Report given to RN.  

## 2020-05-11 ENCOUNTER — Telehealth: Payer: Self-pay

## 2020-05-11 NOTE — Telephone Encounter (Signed)
°  Follow up Call-  Call back number 05/09/2020  Post procedure Call Back phone  # [phone]  Permission to leave phone message Yes  Some recent data might be hidden     Patient questions:  Do you have a fever, pain , or abdominal swelling? No. Pain Score  0 *  Have you tolerated food without any problems? Yes.    Have you been able to return to your normal activities? Yes.    Do you have any questions about your discharge instructions: Diet   No. Medications  No. Follow up visit  No.  Do you have questions or concerns about your Care? Yes.     Patient states she has some discomfort at her jaw and is curious if she was "bagged" during the procedure. Patient is a respiratory therapist. She states it is improving and is just curious. I told her I will talk to her CRNA today and call her back later.  Actions: * If pain score is 4 or above: No action needed, pain <4. 1. Have you developed a fever since your procedure? no  2.   Have you had an respiratory symptoms (SOB or cough) since your procedure? no  3.   Have you tested positive for COVID 19 since your procedure no  4.   Have you had any family members/close contacts diagnosed with the COVID 19 since your procedure?  no   If yes to any of these questions please route to Joylene John, RN and Joella Prince, RN

## 2020-05-11 NOTE — Telephone Encounter (Signed)
Returned patient's call after consulting with her CRNA. Patient was not "bagged" during procedure per CRNA, her jaw was lifted to keep open airway as per procedure. Patient verbalizes understanding, will taken OTC meds for discomfort as needed, but she states her discomfort is improved and she was just curious as to whether or not she was "bagged" during procedure.

## 2020-05-24 ENCOUNTER — Other Ambulatory Visit: Payer: Self-pay

## 2020-05-24 ENCOUNTER — Ambulatory Visit: Payer: Commercial Managed Care - PPO

## 2020-05-24 ENCOUNTER — Encounter: Payer: Self-pay | Admitting: Gastroenterology

## 2020-05-24 NOTE — Progress Notes (Unsigned)
Mental Health Therapist Progress Note   Name: Jodi Pruitt  Total time: 50   Type of Service: Individual Outpatient Mental Health Therapy  OBJECTIVE:  Mood: Euthymic and Affect: Appropriate Risk of harm to self or others: No plan to harm self or others  DIAGNOSIS:   GOALS ADDRESSED:  Patient will: 1.  Reduce symptoms of: stress  2.  Increase knowledge and/or ability of: coping skills  3.  Demonstrate ability to: Increase healthy adjustment to current life circumstances  INTERVENTIONS: Interventions utilized:  Supportive Counseling Therapist met with patient for outpatient mental health individual therapy to include ongoing assessment, support, and reinforcement.  Therapist allowed patient to "check in" since previous session; asking patient to share any positive coping skills they may have used over the previous week, along with any challenges faced.  Therapist provided supportive listening as patient processed their thoughts, emotional responses, and behaviors surrounding several stressors.  EFFECTIVENESS/PLAN: Client was alert, oriented x3, with no SI, HI, or symptoms of psychosis (risk low).  Client was pleasant and friendly, engaging openly and appropriately with therapist, benefiting from supportive listening and exploration of feelings.  Next session recommended in one to two weeks.   Hughes Better, LCSW

## 2020-06-06 ENCOUNTER — Other Ambulatory Visit: Payer: Self-pay

## 2020-06-06 ENCOUNTER — Ambulatory Visit
Admission: RE | Admit: 2020-06-06 | Discharge: 2020-06-06 | Disposition: A | Discharge status: Home / Self Care | Payer: Commercial Managed Care - PPO | Source: Ambulatory Visit | Attending: Family Medicine | Admitting: Family Medicine

## 2020-06-06 DIAGNOSIS — Z1231 Encounter for screening mammogram for malignant neoplasm of breast: Secondary | ICD-10-CM

## 2020-06-07 ENCOUNTER — Ambulatory Visit: Payer: Commercial Managed Care - PPO

## 2020-06-07 ENCOUNTER — Other Ambulatory Visit: Payer: Self-pay

## 2020-06-28 ENCOUNTER — Ambulatory Visit: Payer: Commercial Managed Care - PPO

## 2020-06-28 ENCOUNTER — Other Ambulatory Visit: Payer: Self-pay

## 2020-08-16 ENCOUNTER — Ambulatory Visit: Payer: Commercial Managed Care - PPO

## 2020-08-16 ENCOUNTER — Other Ambulatory Visit: Payer: Self-pay

## 2020-09-05 ENCOUNTER — Other Ambulatory Visit: Payer: Self-pay | Admitting: *Deleted

## 2020-09-05 MED ORDER — LOSARTAN POTASSIUM 50 MG PO TABS: 50.0000 mg | ORAL_TABLET | Freq: Every day | ORAL | 0 refills | Status: AC

## 2020-09-13 ENCOUNTER — Ambulatory Visit: Payer: Commercial Managed Care - PPO

## 2020-11-26 ENCOUNTER — Other Ambulatory Visit: Payer: Self-pay | Admitting: Emergency Medicine

## 2020-12-11 ENCOUNTER — Ambulatory Visit (HOSPITAL_COMMUNITY)
Admission: EM | Admit: 2020-12-11 | Discharge: 2020-12-11 | Disposition: A | Discharge status: Home / Self Care | Payer: Commercial Managed Care - PPO | Attending: Urgent Care | Admitting: Urgent Care

## 2020-12-11 ENCOUNTER — Encounter (HOSPITAL_COMMUNITY): Payer: Self-pay | Admitting: Emergency Medicine

## 2020-12-11 ENCOUNTER — Ambulatory Visit (INDEPENDENT_AMBULATORY_CARE_PROVIDER_SITE_OTHER): Payer: Commercial Managed Care - PPO

## 2020-12-11 ENCOUNTER — Other Ambulatory Visit: Payer: Self-pay

## 2020-12-11 DIAGNOSIS — W19XXXA Unspecified fall, initial encounter: Secondary | ICD-10-CM | POA: Diagnosis not present

## 2020-12-11 DIAGNOSIS — M25531 Pain in right wrist: Secondary | ICD-10-CM | POA: Diagnosis not present

## 2020-12-11 DIAGNOSIS — S52501A Unspecified fracture of the lower end of right radius, initial encounter for closed fracture: Secondary | ICD-10-CM

## 2020-12-11 DIAGNOSIS — S62101A Fracture of unspecified carpal bone, right wrist, initial encounter for closed fracture: Secondary | ICD-10-CM

## 2020-12-11 NOTE — ED Triage Notes (Signed)
Pt presents with right wrist pain after fall this am. States was walking dog into vet when dog pulled and caused her to fall.

## 2020-12-11 NOTE — Discharge Instructions (Addendum)
You have a right wrist fracture. Please wear the splint at all times. Follow up with Raliegh Ip as soon as possible for further management of your wrist fracture. You may take 500mg -650mg  Tylenol with ibuprofen 400-600mg  every 6 hours for wrist pain, aches, and inflammation.

## 2020-12-11 NOTE — Progress Notes (Signed)
Orthopedic Tech Progress Note Patient Details:  Jodi Pruitt Aug 31, 1955 [id number]  Ortho Devices Type of Ortho Device: Arm sling, Sugartong splint Ortho Device/Splint Location: RUE Ortho Device/Splint Interventions: Ordered, Application, Adjustment   Post Interventions Patient Tolerated: Well Instructions Provided: Care of device  [name] 12/11/2020, 1:08 PM

## 2020-12-11 NOTE — ED Provider Notes (Signed)
[place]   MRN: [id number] DOB: 31-Jan-1956  Subjective:   Jodi Pruitt is a 65 y.o. female presenting for suffering a right wrist fall this morning.  Patient was at the vet with her dog and the dog jerked her head causing her to slip and fall on an outstretched right hand.  Denies head injury, loss conscious, confusion, dizziness.  She has a history of substance abuse, hepatitis C.  Has not taken any medication for pain.  She also has a remote history of a nephrectomy of the right side.  No current facility-administered medications for this encounter.  Current Outpatient Medications:    amLODipine (NORVASC) 10 MG tablet, Take 1 tablet (10 mg total) by mouth daily., Disp: 90 tablet, Rfl: 1   losartan (COZAAR) 50 MG tablet, Take 1 tablet (50 mg total) by mouth daily., Disp: 90 tablet, Rfl: 0   sertraline (ZOLOFT) 100 MG tablet, Take 1.5 tablets (150 mg total) by mouth daily., Disp: 135 tablet, Rfl: 1   atorvastatin (LIPITOR) 10 MG tablet, Take 1 tablet (10 mg total) by mouth daily. (Patient not taking: No sig reported), Disp: 90 tablet, Rfl: 1   Naproxen Sodium (ALEVE PO), Take by mouth as needed. Reported on 09/04/2015, Disp: , Rfl:    Allergies  Allergen Reactions   Compazine [Prochlorperazine Edisylate] Other (See Comments)    HALLUCINATION    Past Medical History:  Diagnosis Date   Allergy    Anxiety    Arthritis    Cataract    forming    Depression    GERD (gastroesophageal reflux disease)    Hepatitis C, chronic (HCC)    HTN (hypertension)    Hyperlipidemia    Substance abuse ([place])    Phreesia 02/28/2020     Past Surgical History:  Procedure Laterality Date   CESAREAN SECTION     COLONOSCOPY     ~ 15 yrs ago- pt unsure where and results- she thinks small polyps but not sure    FRACTURE SURGERY Left    wrist    NEPHRECTOMY Right     Family History  Problem Relation Age of Onset   Hypertension Mother    Heart disease Mother     Hyperlipidemia Mother    Stroke Mother    Parkinson's disease Father    Heart disease Father    Cancer Sister    Breast cancer Sister    Lymphoma Sister    Cancer Maternal Grandmother    Colon cancer Maternal Grandmother    Colon polyps Neg Hx    Esophageal cancer Neg Hx    Rectal cancer Neg Hx    Stomach cancer Neg Hx     Social History   Tobacco Use   Smoking status: Every Day    Packs/day: 1.50    Years: 40.00    Pack years: 60.00    Types: Cigarettes   Smokeless tobacco: Never  Substance Use Topics   Alcohol use: Yes    Comment: DRINK WINE daily- 4-6 glasses a day    Drug use: Never    ROS   Objective:   Vitals: BP (!) 158/83 (BP Location: Right Arm)   Pulse 80   Temp 98.1 F (36.7 C) (Oral)   Resp 17   SpO2 96%   Physical Exam Constitutional:      General: She is not in acute distress.    Appearance: Normal appearance. She is well-developed. She is not ill-appearing, toxic-appearing or diaphoretic.  HENT:     Head: Normocephalic and atraumatic.     Nose: Nose normal.     Mouth/Throat:     Mouth: Mucous membranes are moist.     Pharynx: Oropharynx is clear.  Eyes:     General: No scleral icterus.    Extraocular Movements: Extraocular movements intact.     Pupils: Pupils are equal, round, and reactive to light.  Cardiovascular:     Rate and Rhythm: Normal rate.  Pulmonary:     Effort: Pulmonary effort is normal.  Musculoskeletal:     Right forearm: No swelling, edema, deformity, lacerations, tenderness or bony tenderness.     Right wrist: Swelling, deformity, tenderness (over area outlined with associated ecchymosis) and bony tenderness present. No effusion, lacerations, snuff box tenderness or crepitus. Decreased range of motion.     Right hand: No swelling, deformity, lacerations, tenderness or bony tenderness. Normal range of motion. Normal strength. Normal capillary refill.       Arms:     Comments: Radial pulse 2+.  Skin:    General: Skin  is warm and dry.  Neurological:     General: No focal deficit present.     Mental Status: She is alert and oriented to person, place, and time.  Psychiatric:        Mood and Affect: Mood normal.        Behavior: Behavior normal.        Thought Content: Thought content normal.        Judgment: Judgment normal.    DG Wrist Complete Right  Result Date: 12/11/2020 CLINICAL DATA:  Fall, walking the dog with fall and pain. EXAM: RIGHT WRIST - COMPLETE 3+ VIEW COMPARISON:  None FINDINGS: Impacted distal radius fracture with dorsal tilt of the radial articular surface. Mild comminution with displacement dorsally of fracture fragment along the dorsal aspect of the distal radius. Transverse orientation of the dominant aspect of the fracture. Fracture through the distal radial metaphysis, no definite intra-articular extension. No additional fracture. IMPRESSION: Impacted mildly comminuted distal radius fracture with moderate dorsal tilt of the radial articular surface. Electronically Signed   By: Zetta Bills M.D.   On: 12/11/2020 12:25     Assessment and Plan :   PDMP not reviewed this encounter.  1. Right wrist pain   2. Closed fracture of right wrist, initial encounter   3. Closed fracture of distal end of right radius, unspecified fracture morphology, initial encounter     Reviewed images and over read with patient outlining the radial fracture that she suffered.  She is to be placed in a sugar-tong splint, schedule Tylenol and use ibuprofen or naproxen for pain control.  Patient declined narcotic pain medication and I am in agreement given her history of substance abuse.  Follow-up closely with the orthopedist on-call, Dr. Percell Miller. Counseled patient on potential for adverse effects with medications prescribed/recommended today, ER and return-to-clinic precautions discussed, patient verbalized understanding.    Jaynee Eagles, PA-C 12/11/20 1257

## 2021-02-19 ENCOUNTER — Ambulatory Visit (HOSPITAL_COMMUNITY)
Admission: EM | Admit: 2021-02-19 | Discharge: 2021-02-19 | Disposition: A | Discharge status: Home / Self Care | Payer: Medicare Other | Attending: Psychiatry | Admitting: Psychiatry

## 2021-02-19 ENCOUNTER — Other Ambulatory Visit: Payer: Self-pay

## 2021-02-19 DIAGNOSIS — F17210 Nicotine dependence, cigarettes, uncomplicated: Secondary | ICD-10-CM | POA: Diagnosis not present

## 2021-02-19 DIAGNOSIS — F331 Major depressive disorder, recurrent, moderate: Secondary | ICD-10-CM | POA: Insufficient documentation

## 2021-02-19 DIAGNOSIS — Z79899 Other long term (current) drug therapy: Secondary | ICD-10-CM | POA: Insufficient documentation

## 2021-02-19 MED ORDER — SERTRALINE HCL 50 MG PO TABS: 50.0000 mg | ORAL_TABLET | Freq: Every day | ORAL | 0 refills | Status: AC

## 2021-02-19 NOTE — Discharge Instructions (Addendum)
Please establish care at your next PCP appointment with Jodi Pruitt on 9/15.
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
## 2022-02-05 ENCOUNTER — Other Ambulatory Visit: Payer: Self-pay | Admitting: Emergency Medicine

## 2022-07-30 IMAGING — DX DG WRIST COMPLETE 3+V*R*
4 series · 4 of 4 positions shown · non-contrast
Comparison: None

CLINICAL DATA: Fall, walking the dog with fall and pain.

EXAM:
RIGHT WRIST - COMPLETE 3+ VIEW

[wrist pa]
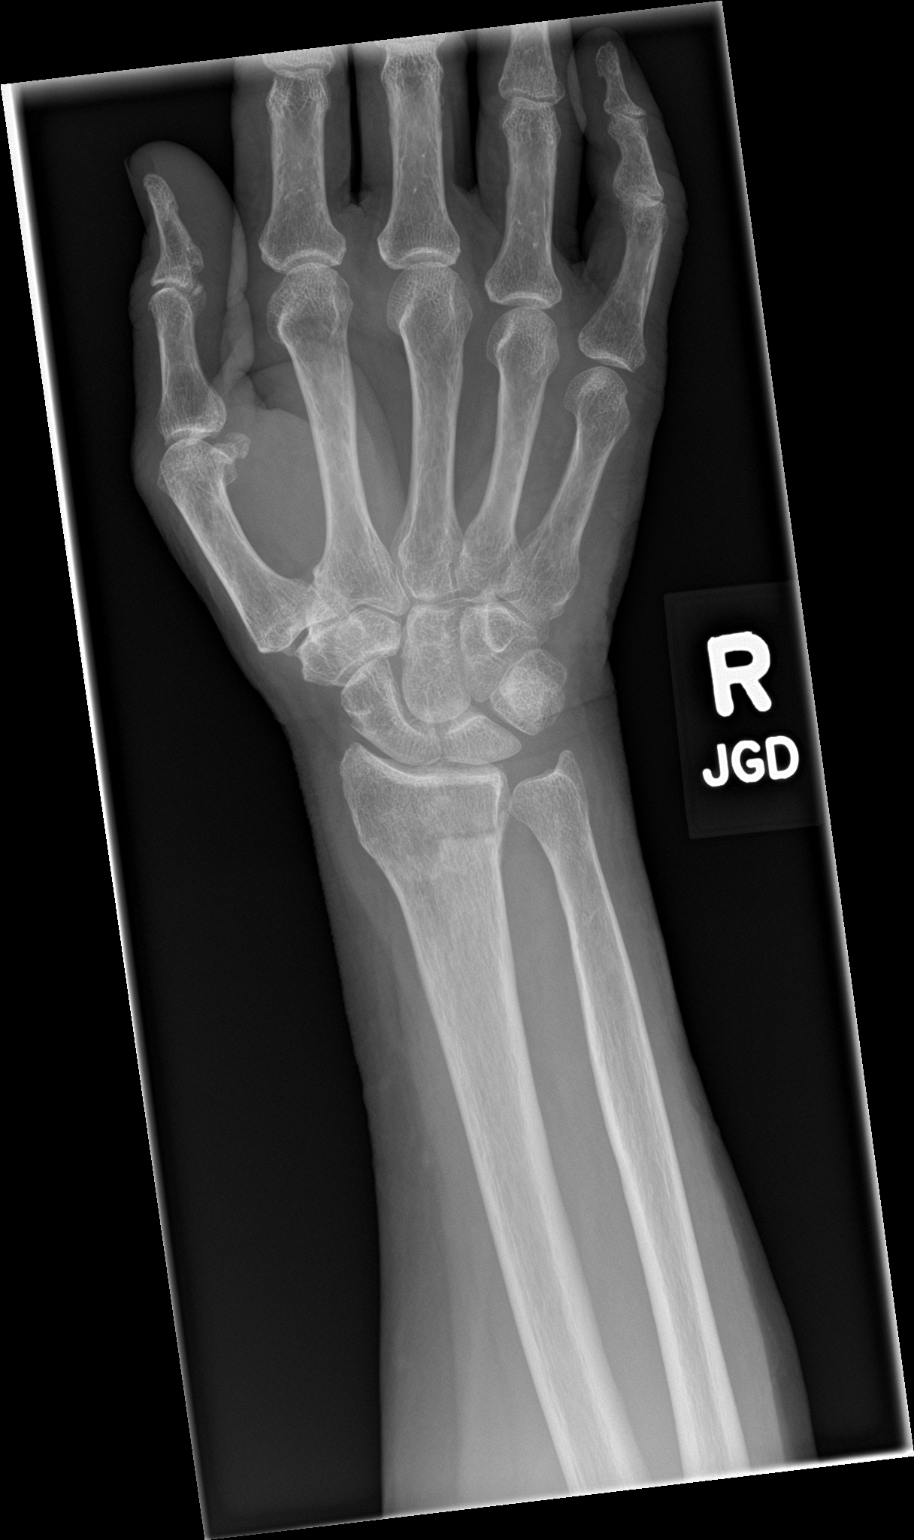

[wrist navicular]
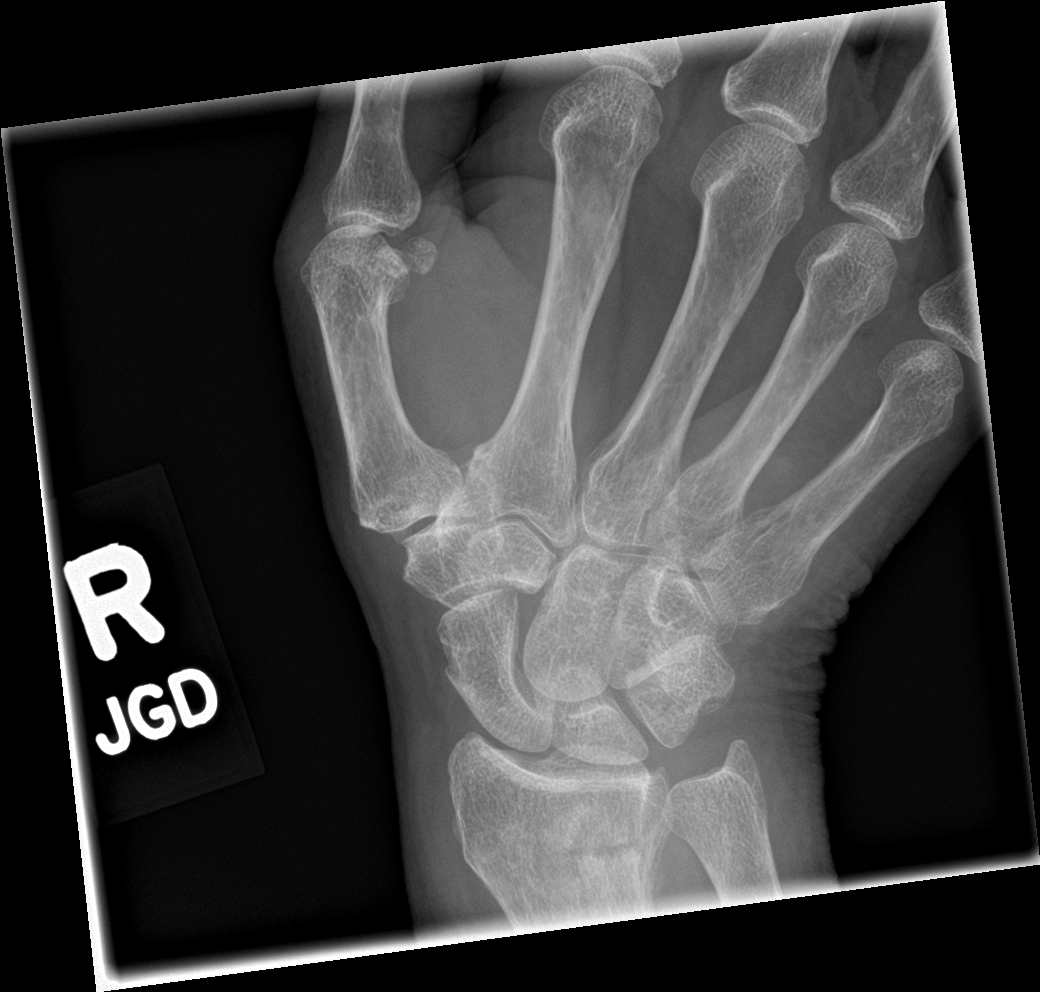

[wrist obl]
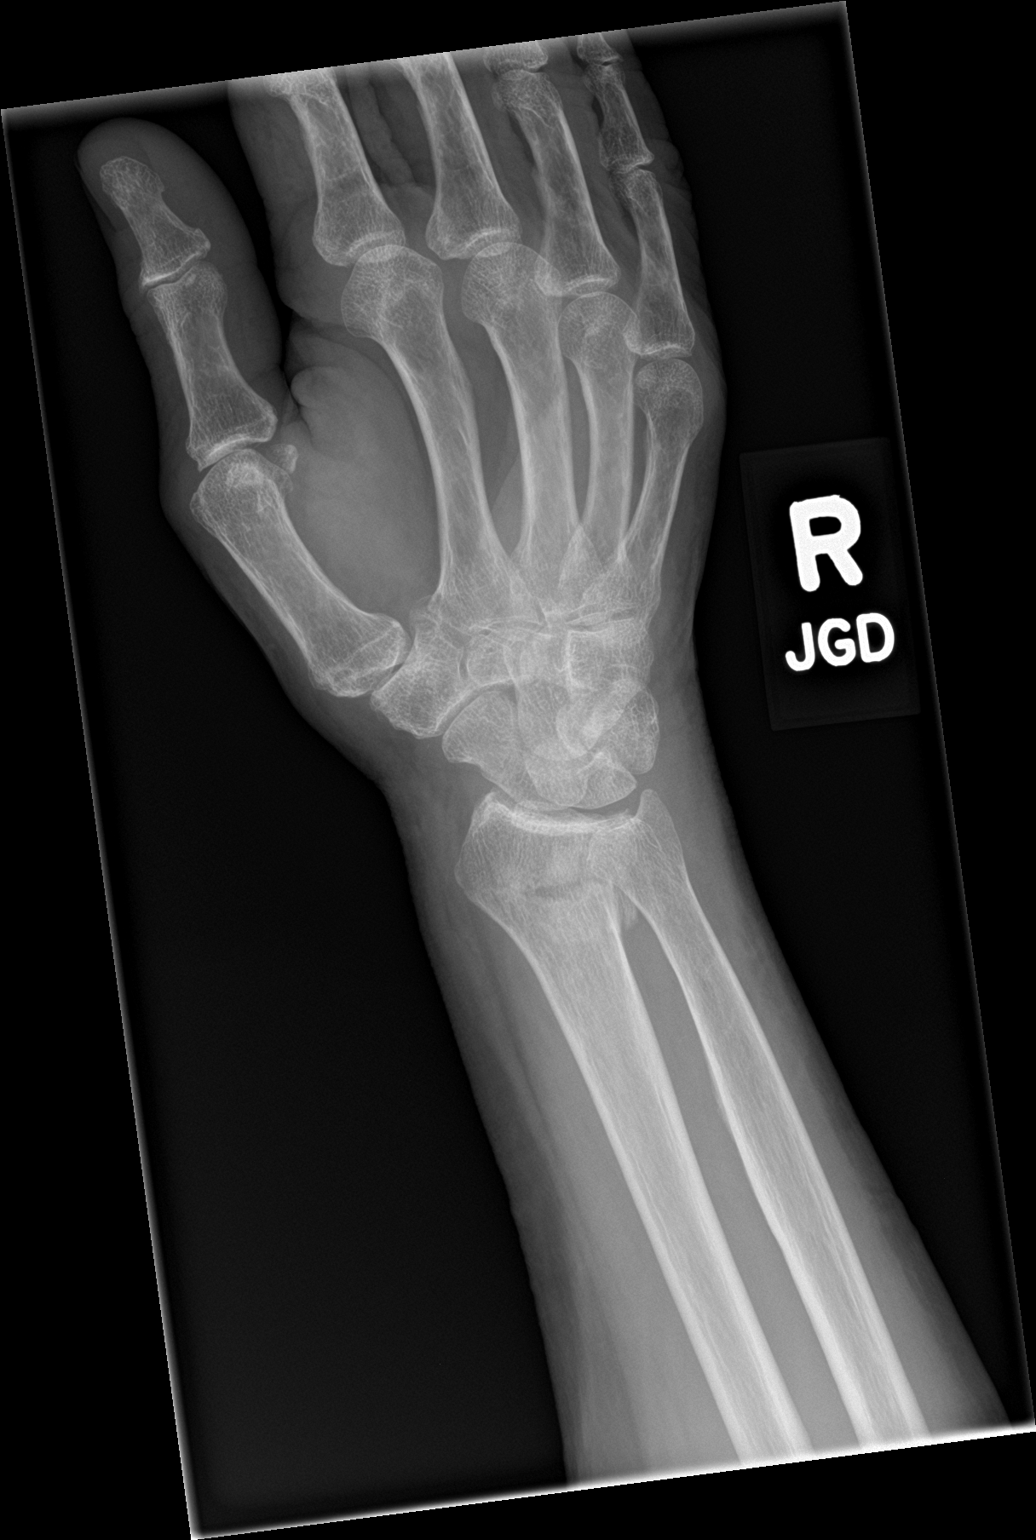

[wrist lat]
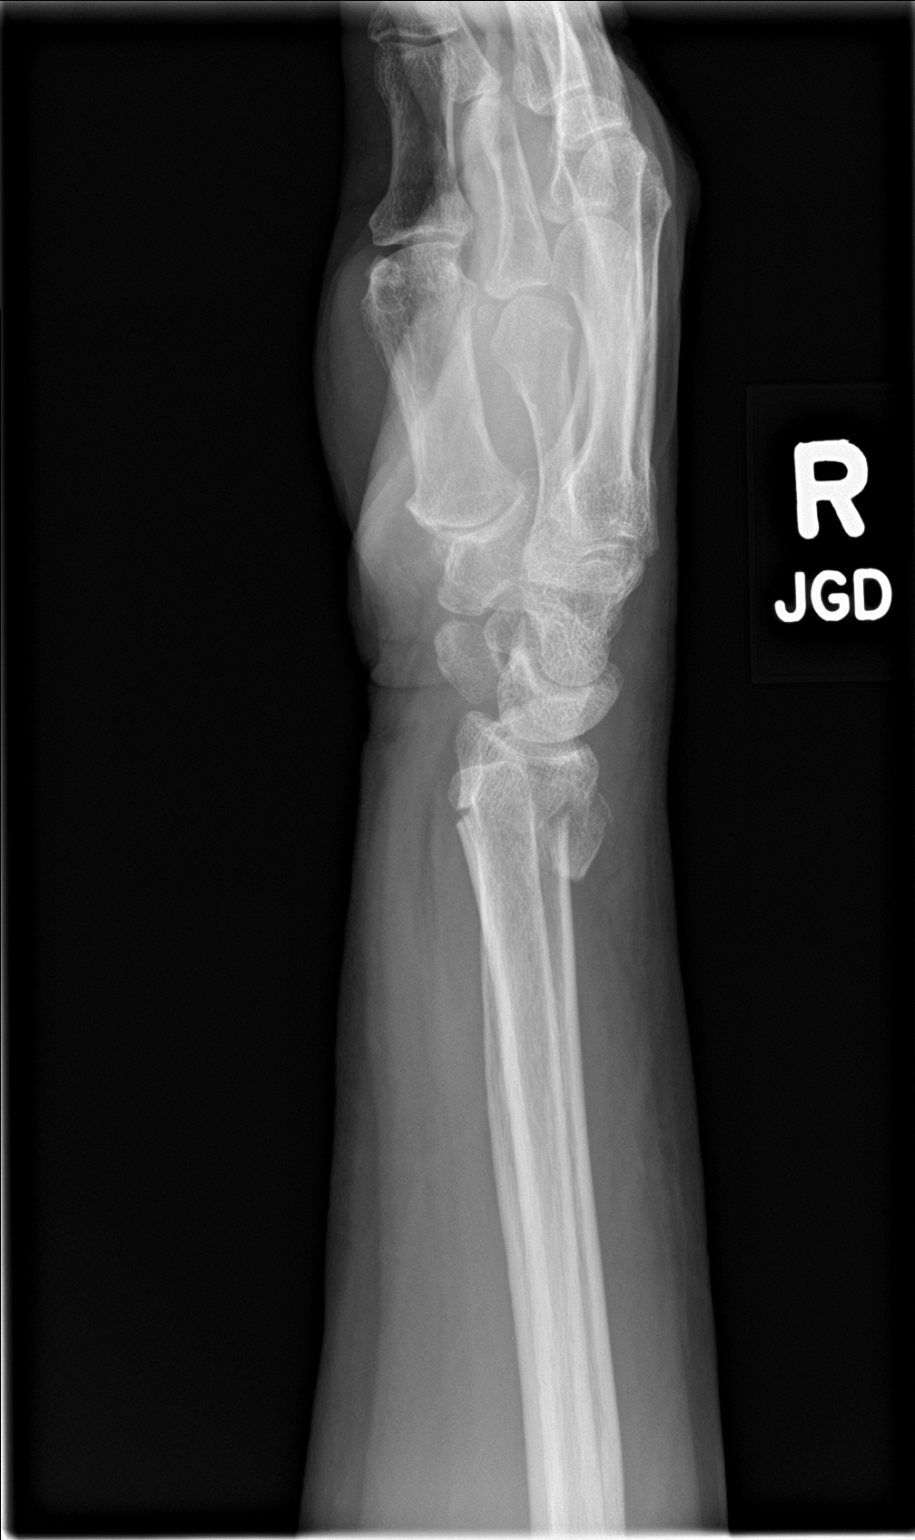

[4 of 4 positions shown; findings below may reference images not displayed]

FINDINGS: Impacted distal radius fracture with dorsal tilt of the radial
articular surface. Mild comminution with displacement dorsally of
fracture fragment along the dorsal aspect of the distal radius.
Transverse orientation of the dominant aspect of the fracture.
Fracture through the distal radial metaphysis, no definite
intra-articular extension.

No additional fracture.
IMPRESSION: Impacted mildly comminuted distal radius fracture with moderate
dorsal tilt of the radial articular surface.

## 2022-10-15 DIAGNOSIS — Z9181 History of falling: Secondary | ICD-10-CM | POA: Diagnosis not present

## 2022-10-15 DIAGNOSIS — I10 Essential (primary) hypertension: Secondary | ICD-10-CM | POA: Diagnosis not present

## 2022-10-15 DIAGNOSIS — R2681 Unsteadiness on feet: Secondary | ICD-10-CM | POA: Diagnosis not present

## 2023-04-06 DIAGNOSIS — I868 Varicose veins of other specified sites: Secondary | ICD-10-CM | POA: Diagnosis not present

## 2023-04-06 DIAGNOSIS — Z136 Encounter for screening for cardiovascular disorders: Secondary | ICD-10-CM | POA: Diagnosis not present

## 2023-04-06 DIAGNOSIS — Z9181 History of falling: Secondary | ICD-10-CM | POA: Diagnosis not present

## 2023-04-06 DIAGNOSIS — I10 Essential (primary) hypertension: Secondary | ICD-10-CM | POA: Diagnosis not present

## 2023-04-06 DIAGNOSIS — R2681 Unsteadiness on feet: Secondary | ICD-10-CM | POA: Diagnosis not present

## 2023-04-06 DIAGNOSIS — Z0000 Encounter for general adult medical examination without abnormal findings: Secondary | ICD-10-CM | POA: Diagnosis not present

## 2023-04-06 DIAGNOSIS — Z23 Encounter for immunization: Secondary | ICD-10-CM | POA: Diagnosis not present

## 2023-04-06 DIAGNOSIS — R270 Ataxia, unspecified: Secondary | ICD-10-CM | POA: Diagnosis not present

## 2023-04-06 DIAGNOSIS — F17200 Nicotine dependence, unspecified, uncomplicated: Secondary | ICD-10-CM | POA: Diagnosis not present

## 2023-04-06 DIAGNOSIS — R748 Abnormal levels of other serum enzymes: Secondary | ICD-10-CM | POA: Diagnosis not present

## 2023-04-08 DIAGNOSIS — M6281 Muscle weakness (generalized): Secondary | ICD-10-CM | POA: Diagnosis not present

## 2023-04-08 DIAGNOSIS — R2689 Other abnormalities of gait and mobility: Secondary | ICD-10-CM | POA: Diagnosis not present

## 2023-04-15 DIAGNOSIS — R2689 Other abnormalities of gait and mobility: Secondary | ICD-10-CM | POA: Diagnosis not present

## 2023-04-15 DIAGNOSIS — M6281 Muscle weakness (generalized): Secondary | ICD-10-CM | POA: Diagnosis not present

## 2023-04-17 DIAGNOSIS — R2689 Other abnormalities of gait and mobility: Secondary | ICD-10-CM | POA: Diagnosis not present

## 2023-04-17 DIAGNOSIS — M6281 Muscle weakness (generalized): Secondary | ICD-10-CM | POA: Diagnosis not present

## 2023-04-20 DIAGNOSIS — R2689 Other abnormalities of gait and mobility: Secondary | ICD-10-CM | POA: Diagnosis not present

## 2023-04-20 DIAGNOSIS — M6281 Muscle weakness (generalized): Secondary | ICD-10-CM | POA: Diagnosis not present

## 2023-04-22 DIAGNOSIS — M6281 Muscle weakness (generalized): Secondary | ICD-10-CM | POA: Diagnosis not present

## 2023-04-22 DIAGNOSIS — R2689 Other abnormalities of gait and mobility: Secondary | ICD-10-CM | POA: Diagnosis not present

## 2023-04-24 ENCOUNTER — Other Ambulatory Visit: Payer: Self-pay | Admitting: Internal Medicine

## 2023-04-24 DIAGNOSIS — R2689 Other abnormalities of gait and mobility: Secondary | ICD-10-CM

## 2023-04-27 DIAGNOSIS — R2689 Other abnormalities of gait and mobility: Secondary | ICD-10-CM | POA: Diagnosis not present

## 2023-04-27 DIAGNOSIS — M6281 Muscle weakness (generalized): Secondary | ICD-10-CM | POA: Diagnosis not present

## 2023-05-06 DIAGNOSIS — M6281 Muscle weakness (generalized): Secondary | ICD-10-CM | POA: Diagnosis not present

## 2023-05-06 DIAGNOSIS — R2689 Other abnormalities of gait and mobility: Secondary | ICD-10-CM | POA: Diagnosis not present

## 2023-05-07 ENCOUNTER — Other Ambulatory Visit: Payer: Self-pay | Admitting: Internal Medicine

## 2023-05-07 DIAGNOSIS — R2689 Other abnormalities of gait and mobility: Secondary | ICD-10-CM

## 2023-05-11 DIAGNOSIS — M6281 Muscle weakness (generalized): Secondary | ICD-10-CM | POA: Diagnosis not present

## 2023-05-11 DIAGNOSIS — R2689 Other abnormalities of gait and mobility: Secondary | ICD-10-CM | POA: Diagnosis not present

## 2023-05-13 DIAGNOSIS — R2689 Other abnormalities of gait and mobility: Secondary | ICD-10-CM | POA: Diagnosis not present

## 2023-05-13 DIAGNOSIS — M6281 Muscle weakness (generalized): Secondary | ICD-10-CM | POA: Diagnosis not present

## 2023-05-18 DIAGNOSIS — R2689 Other abnormalities of gait and mobility: Secondary | ICD-10-CM | POA: Diagnosis not present

## 2023-05-18 DIAGNOSIS — M6281 Muscle weakness (generalized): Secondary | ICD-10-CM | POA: Diagnosis not present

## 2023-05-20 DIAGNOSIS — R2689 Other abnormalities of gait and mobility: Secondary | ICD-10-CM | POA: Diagnosis not present

## 2023-05-20 DIAGNOSIS — M6281 Muscle weakness (generalized): Secondary | ICD-10-CM | POA: Diagnosis not present

## 2023-05-25 DIAGNOSIS — M6281 Muscle weakness (generalized): Secondary | ICD-10-CM | POA: Diagnosis not present

## 2023-05-25 DIAGNOSIS — R2689 Other abnormalities of gait and mobility: Secondary | ICD-10-CM | POA: Diagnosis not present

## 2023-05-27 DIAGNOSIS — M6281 Muscle weakness (generalized): Secondary | ICD-10-CM | POA: Diagnosis not present

## 2023-05-27 DIAGNOSIS — R2689 Other abnormalities of gait and mobility: Secondary | ICD-10-CM | POA: Diagnosis not present

## 2023-05-29 ENCOUNTER — Ambulatory Visit
Admission: RE | Admit: 2023-05-29 | Discharge: 2023-05-29 | Disposition: A | Discharge status: Home / Self Care | Payer: Medicare Other | Source: Ambulatory Visit | Attending: Internal Medicine | Admitting: Internal Medicine

## 2023-05-29 DIAGNOSIS — R2689 Other abnormalities of gait and mobility: Secondary | ICD-10-CM

## 2023-05-29 DIAGNOSIS — R296 Repeated falls: Secondary | ICD-10-CM | POA: Diagnosis not present

## 2023-05-29 DIAGNOSIS — I6782 Cerebral ischemia: Secondary | ICD-10-CM | POA: Diagnosis not present

## 2023-06-01 DIAGNOSIS — M6281 Muscle weakness (generalized): Secondary | ICD-10-CM | POA: Diagnosis not present

## 2023-06-01 DIAGNOSIS — R2689 Other abnormalities of gait and mobility: Secondary | ICD-10-CM | POA: Diagnosis not present

## 2023-06-03 DIAGNOSIS — R2689 Other abnormalities of gait and mobility: Secondary | ICD-10-CM | POA: Diagnosis not present

## 2023-06-03 DIAGNOSIS — M6281 Muscle weakness (generalized): Secondary | ICD-10-CM | POA: Diagnosis not present

## 2023-06-08 DIAGNOSIS — M6281 Muscle weakness (generalized): Secondary | ICD-10-CM | POA: Diagnosis not present

## 2023-06-08 DIAGNOSIS — R2689 Other abnormalities of gait and mobility: Secondary | ICD-10-CM | POA: Diagnosis not present

## 2023-06-10 DIAGNOSIS — M6281 Muscle weakness (generalized): Secondary | ICD-10-CM | POA: Diagnosis not present

## 2023-06-10 DIAGNOSIS — R2689 Other abnormalities of gait and mobility: Secondary | ICD-10-CM | POA: Diagnosis not present

## 2023-06-12 DIAGNOSIS — R2681 Unsteadiness on feet: Secondary | ICD-10-CM | POA: Diagnosis not present

## 2023-06-12 DIAGNOSIS — I10 Essential (primary) hypertension: Secondary | ICD-10-CM | POA: Diagnosis not present

## 2023-06-19 DIAGNOSIS — R2689 Other abnormalities of gait and mobility: Secondary | ICD-10-CM | POA: Diagnosis not present

## 2023-06-19 DIAGNOSIS — M6281 Muscle weakness (generalized): Secondary | ICD-10-CM | POA: Diagnosis not present

## 2023-06-26 DIAGNOSIS — R2689 Other abnormalities of gait and mobility: Secondary | ICD-10-CM | POA: Diagnosis not present

## 2023-06-26 DIAGNOSIS — M6281 Muscle weakness (generalized): Secondary | ICD-10-CM | POA: Diagnosis not present

## 2023-07-06 DIAGNOSIS — R2689 Other abnormalities of gait and mobility: Secondary | ICD-10-CM | POA: Diagnosis not present

## 2023-07-06 DIAGNOSIS — M6281 Muscle weakness (generalized): Secondary | ICD-10-CM | POA: Diagnosis not present

## 2023-07-08 DIAGNOSIS — R2689 Other abnormalities of gait and mobility: Secondary | ICD-10-CM | POA: Diagnosis not present

## 2023-07-08 DIAGNOSIS — M6281 Muscle weakness (generalized): Secondary | ICD-10-CM | POA: Diagnosis not present

## 2023-07-13 DIAGNOSIS — R2689 Other abnormalities of gait and mobility: Secondary | ICD-10-CM | POA: Diagnosis not present

## 2023-07-13 DIAGNOSIS — M6281 Muscle weakness (generalized): Secondary | ICD-10-CM | POA: Diagnosis not present

## 2023-08-31 DIAGNOSIS — F330 Major depressive disorder, recurrent, mild: Secondary | ICD-10-CM | POA: Diagnosis not present

## 2023-11-19 DIAGNOSIS — F330 Major depressive disorder, recurrent, mild: Secondary | ICD-10-CM | POA: Diagnosis not present

## 2023-12-01 DIAGNOSIS — F330 Major depressive disorder, recurrent, mild: Secondary | ICD-10-CM | POA: Diagnosis not present

## 2023-12-29 DIAGNOSIS — G319 Degenerative disease of nervous system, unspecified: Secondary | ICD-10-CM | POA: Diagnosis not present

## 2023-12-29 DIAGNOSIS — R748 Abnormal levels of other serum enzymes: Secondary | ICD-10-CM | POA: Diagnosis not present

## 2023-12-29 DIAGNOSIS — I10 Essential (primary) hypertension: Secondary | ICD-10-CM | POA: Diagnosis not present

## 2023-12-29 DIAGNOSIS — F1010 Alcohol abuse, uncomplicated: Secondary | ICD-10-CM | POA: Diagnosis not present

## 2023-12-29 DIAGNOSIS — F3341 Major depressive disorder, recurrent, in partial remission: Secondary | ICD-10-CM | POA: Diagnosis not present

## 2024-01-26 DIAGNOSIS — F330 Major depressive disorder, recurrent, mild: Secondary | ICD-10-CM | POA: Diagnosis not present

## 2024-02-09 DIAGNOSIS — F330 Major depressive disorder, recurrent, mild: Secondary | ICD-10-CM | POA: Diagnosis not present

## 2024-02-23 DIAGNOSIS — F330 Major depressive disorder, recurrent, mild: Secondary | ICD-10-CM | POA: Diagnosis not present

## 2024-03-01 DIAGNOSIS — F331 Major depressive disorder, recurrent, moderate: Secondary | ICD-10-CM | POA: Diagnosis not present

## 2024-03-01 DIAGNOSIS — I10 Essential (primary) hypertension: Secondary | ICD-10-CM | POA: Diagnosis not present

## 2024-03-01 DIAGNOSIS — F17200 Nicotine dependence, unspecified, uncomplicated: Secondary | ICD-10-CM | POA: Diagnosis not present

## 2024-03-01 DIAGNOSIS — J40 Bronchitis, not specified as acute or chronic: Secondary | ICD-10-CM | POA: Diagnosis not present

## 2024-03-01 DIAGNOSIS — G319 Degenerative disease of nervous system, unspecified: Secondary | ICD-10-CM | POA: Diagnosis not present

## 2024-03-01 DIAGNOSIS — F1010 Alcohol abuse, uncomplicated: Secondary | ICD-10-CM | POA: Diagnosis not present

## 2024-03-01 DIAGNOSIS — Z23 Encounter for immunization: Secondary | ICD-10-CM | POA: Diagnosis not present

## 2024-03-22 DIAGNOSIS — F330 Major depressive disorder, recurrent, mild: Secondary | ICD-10-CM | POA: Diagnosis not present
